# Patient Record
Sex: Male | Born: 1935 | Race: White | Hispanic: No | Marital: Married | Smoking: Never smoker
Health system: Southern US, Community
[De-identification: ages and names within clinical notes are randomized; demographics above are authoritative.]

## PROBLEM LIST (undated history)

## (undated) DIAGNOSIS — M1990 Unspecified osteoarthritis, unspecified site: Secondary | ICD-10-CM

## (undated) DIAGNOSIS — C7910 Secondary malignant neoplasm of unspecified urinary organs: Secondary | ICD-10-CM

## (undated) DIAGNOSIS — N179 Acute kidney failure, unspecified: Secondary | ICD-10-CM

## (undated) DIAGNOSIS — I214 Non-ST elevation (NSTEMI) myocardial infarction: Secondary | ICD-10-CM

## (undated) DIAGNOSIS — Z87442 Personal history of urinary calculi: Secondary | ICD-10-CM

## (undated) DIAGNOSIS — C4490 Unspecified malignant neoplasm of skin, unspecified: Secondary | ICD-10-CM

## (undated) DIAGNOSIS — H269 Unspecified cataract: Secondary | ICD-10-CM

## (undated) HISTORY — DX: Unspecified malignant neoplasm of skin, unspecified: C44.90

## (undated) HISTORY — DX: Unspecified cataract: H26.9

---
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
## 2009-03-08 ENCOUNTER — Ambulatory Visit: Payer: Self-pay | Admitting: Family Medicine

## 2014-12-29 ENCOUNTER — Inpatient Hospital Stay
Admission: EM | Admit: 2014-12-29 | Discharge: 2014-12-31 | DRG: 247 | Disposition: A | Discharge status: Home / Self Care | Payer: Medicare Other | Attending: Internal Medicine | Admitting: Internal Medicine

## 2014-12-29 ENCOUNTER — Emergency Department: Payer: Medicare Other

## 2014-12-29 DIAGNOSIS — Z955 Presence of coronary angioplasty implant and graft: Secondary | ICD-10-CM

## 2014-12-29 DIAGNOSIS — I10 Essential (primary) hypertension: Secondary | ICD-10-CM | POA: Diagnosis present

## 2014-12-29 DIAGNOSIS — F1010 Alcohol abuse, uncomplicated: Secondary | ICD-10-CM | POA: Diagnosis present

## 2014-12-29 DIAGNOSIS — I444 Left anterior fascicular block: Secondary | ICD-10-CM | POA: Diagnosis present

## 2014-12-29 DIAGNOSIS — I209 Angina pectoris, unspecified: Secondary | ICD-10-CM

## 2014-12-29 DIAGNOSIS — I219 Acute myocardial infarction, unspecified: Secondary | ICD-10-CM | POA: Insufficient documentation

## 2014-12-29 DIAGNOSIS — I214 Non-ST elevation (NSTEMI) myocardial infarction: Secondary | ICD-10-CM | POA: Diagnosis present

## 2014-12-29 DIAGNOSIS — N289 Disorder of kidney and ureter, unspecified: Secondary | ICD-10-CM | POA: Diagnosis present

## 2014-12-29 DIAGNOSIS — R079 Chest pain, unspecified: Secondary | ICD-10-CM

## 2014-12-29 HISTORY — DX: Non-ST elevation (NSTEMI) myocardial infarction: I21.4

## 2014-12-29 LAB — CBC
HEMATOCRIT: 44.4 % (ref 40.0–52.0)
HEMOGLOBIN: 15 g/dL (ref 13.0–18.0)
MCH: 30.7 pg (ref 26.0–34.0)
MCHC: 33.8 g/dL (ref 32.0–36.0)
MCV: 90.9 fL (ref 80.0–100.0)
Platelets: 316 10*3/uL (ref 150–440)
RBC: 4.88 MIL/uL (ref 4.40–5.90)
RDW: 13.4 % (ref 11.5–14.5)
WBC: 7.9 10*3/uL (ref 3.8–10.6)

## 2014-12-29 LAB — BASIC METABOLIC PANEL
ANION GAP: 7 (ref 5–15)
BUN: 14 mg/dL (ref 6–20)
CALCIUM: 9.4 mg/dL (ref 8.9–10.3)
CO2: 27 mmol/L (ref 22–32)
Chloride: 107 mmol/L (ref 101–111)
Creatinine, Ser: 1.25 mg/dL — ABNORMAL HIGH (ref 0.61–1.24)
GFR calc Af Amer: >60 mL/min (ref >60)
GFR calc non Af Amer: 53 mL/min — ABNORMAL LOW (ref >60)
GLUCOSE: 95 mg/dL (ref 65–99)
Potassium: 4.4 mmol/L (ref 3.5–5.1)
Sodium: 141 mmol/L (ref 135–145)

## 2014-12-29 LAB — CKMB (ARMC ONLY): CK, MB: 8 ng/mL — ABNORMAL HIGH (ref 0.5–5.0)

## 2014-12-29 LAB — TROPONIN I: Troponin I: 0.12 ng/mL — ABNORMAL HIGH (ref <0.031)

## 2014-12-29 LAB — BRAIN NATRIURETIC PEPTIDE: B NATRIURETIC PEPTIDE 5: 149 pg/mL — AB (ref 0.0–100.0)

## 2014-12-29 MED ORDER — SODIUM CHLORIDE 0.9 % IJ SOLN: 3.0000 mL | Freq: Two times a day (BID) | INTRAMUSCULAR | Status: DC

## 2014-12-29 MED ORDER — ACETAMINOPHEN 650 MG RE SUPP
650.0000 mg | Freq: Four times a day (QID) | RECTAL | Status: DC | PRN
Start: 2014-12-29 — End: 2014-12-31

## 2014-12-29 MED ORDER — HEPARIN (PORCINE) IN NACL 100-0.45 UNIT/ML-% IJ SOLN
1000.0000 [IU]/h | INTRAMUSCULAR | Status: DC
  Administered 2014-12-29: 1000 [IU]/h via INTRAVENOUS
  Filled 2014-12-29 (×3): qty 250

## 2014-12-29 MED ORDER — ASPIRIN EC 325 MG PO TBEC
325.0000 mg | DELAYED_RELEASE_TABLET | Freq: Every day | ORAL | Status: DC
  Administered 2014-12-30: 325 mg via ORAL
  Filled 2014-12-29: qty 1

## 2014-12-29 MED ORDER — METOPROLOL TARTRATE 25 MG PO TABS
25.0000 mg | ORAL_TABLET | Freq: Two times a day (BID) | ORAL | Status: DC
Start: 2014-12-29 — End: 2014-12-31
  Administered 2014-12-29 – 2014-12-31 (×3): 25 mg via ORAL
  Filled 2014-12-29 (×3): qty 1

## 2014-12-29 MED ORDER — ASPIRIN 81 MG PO CHEW
CHEWABLE_TABLET | ORAL | Status: AC
  Administered 2014-12-29: 324 mg via ORAL
  Filled 2014-12-29: qty 4

## 2014-12-29 MED ORDER — MORPHINE SULFATE 2 MG/ML IJ SOLN: 2.0000 mg | INTRAMUSCULAR | Status: DC | PRN

## 2014-12-29 MED ORDER — METOPROLOL TARTRATE 25 MG PO TABS
ORAL_TABLET | ORAL | Status: AC
  Administered 2014-12-29: 25 mg via ORAL
  Filled 2014-12-29: qty 1

## 2014-12-29 MED ORDER — NITROGLYCERIN 2 % TD OINT
1.0000 [in_us] | TOPICAL_OINTMENT | Freq: Four times a day (QID) | TRANSDERMAL | Status: DC
  Administered 2014-12-29 – 2014-12-31 (×5): 1 [in_us] via TOPICAL
  Filled 2014-12-29 (×4): qty 1

## 2014-12-29 MED ORDER — ONDANSETRON HCL 4 MG PO TABS: 4.0000 mg | ORAL_TABLET | Freq: Four times a day (QID) | ORAL | Status: DC | PRN

## 2014-12-29 MED ORDER — ACETAMINOPHEN 325 MG PO TABS: 650.0000 mg | ORAL_TABLET | Freq: Four times a day (QID) | ORAL | Status: DC | PRN

## 2014-12-29 MED ORDER — HEPARIN BOLUS VIA INFUSION
4000.0000 [IU] | Freq: Once | INTRAVENOUS | Status: AC
  Administered 2014-12-29: 4000 [IU] via INTRAVENOUS
  Filled 2014-12-29: qty 4000

## 2014-12-29 MED ORDER — SODIUM CHLORIDE 0.9 % IV SOLN
INTRAVENOUS | Status: DC
  Administered 2014-12-29 – 2014-12-30 (×2): via INTRAVENOUS

## 2014-12-29 MED ORDER — ASPIRIN 81 MG PO CHEW
324.0000 mg | CHEWABLE_TABLET | Freq: Once | ORAL | Status: AC
  Administered 2014-12-29: 324 mg via ORAL

## 2014-12-29 MED ORDER — NITROGLYCERIN 2 % TD OINT
TOPICAL_OINTMENT | TRANSDERMAL | Status: AC
  Administered 2014-12-29: 1 [in_us] via TOPICAL
  Filled 2014-12-29: qty 1

## 2014-12-29 MED ORDER — ONDANSETRON HCL 4 MG/2ML IJ SOLN: 4.0000 mg | Freq: Four times a day (QID) | INTRAMUSCULAR | Status: DC | PRN

## 2014-12-29 NOTE — ED Provider Notes (Signed)
[facility] Emergency Department Provider Note  ____________________________________________  Time seen: Approximately 4:02 PM  I have reviewed the triage vital signs and the nursing notes.   HISTORY  Chief Complaint Chest Pain    HPI Michael Brennan is a 81 y.o. male with no chronic medical problems presents for evaluation of 2 weeks of intermittent epigastric and left-sided aching, worse with exertion, improves with rest. Patient usually notices the pain when he is doing yardwork. If not associated with any diaphoresis, nausea, dizziness/lightheadedness. Last night he developed a minor chest pain while lying in bed. Currently his pain has resolved. He has no prior history of coronary artery disease. Pain is not radiating, not pleuritic. Current severity 0 out of 10.   No past medical history on file.  Patient Active Problem List   Diagnosis Date Noted  . Acute non Q wave MI (myocardial infarction), initial episode of care 12/29/2014    No past surgical history on file.  No current outpatient prescriptions on file.  Allergies Review of patient's allergies indicates no known allergies.  No family history on file.  Social History History  Substance Use Topics  . Smoking status: Not on file  . Smokeless tobacco: Not on file  . Alcohol Use: Not on file    Review of Systems Constitutional: No fever/chills Eyes: No visual changes. ENT: No sore throat. Cardiovascular: + chest pain. Respiratory: Denies shortness of breath. Gastrointestinal: No abdominal pain.  No nausea, no vomiting.  No diarrhea.  No constipation. Genitourinary: Negative for dysuria. Musculoskeletal: Negative for back pain. Skin: Negative for rash. Neurological: Negative for headaches, focal weakness or numbness.  10-point ROS otherwise negative.  ____________________________________________   PHYSICAL EXAM:  VITAL SIGNS: ED Triage Vitals  Enc Vitals Group     BP  12/29/14 1459 166/62 mmHg     Pulse Rate 12/29/14 1459 76     Resp 12/29/14 1459 18     Temp 12/29/14 1459 98.2 F (36.8 C)     Temp Source 12/29/14 1459 Oral     SpO2 12/29/14 1459 96 %     Weight 12/29/14 1459 170 lb (77.111 kg)     Height 12/29/14 1459 5\' 8"  (1.727 m)     Head Cir --      Peak Flow --      Pain Score 12/29/14 1502 1     Pain Loc --      Pain Edu? --      Excl. in [place]? --     Constitutional: Alert and oriented. Well appearing and in no acute distress. Eyes: Conjunctivae are normal. PERRL. EOMI. Head: Atraumatic. Nose: No congestion/rhinnorhea. Mouth/Throat: Mucous membranes are moist.  Oropharynx non-erythematous. Neck: No stridor.  Cardiovascular: Normal rate, regular rhythm. Grossly normal heart sounds.  Good peripheral circulation. Respiratory: Normal respiratory effort.  No retractions. Lungs CTAB. Gastrointestinal: Soft and nontender. No distention. No abdominal bruits. No CVA tenderness. Genitourinary: deferred Musculoskeletal: No lower extremity tenderness nor edema.  No joint effusions. Neurologic:  Normal speech and language. No gross focal neurologic deficits are appreciated. Speech is normal. No gait instability. Skin:  Skin is warm, dry and intact. No rash noted. Psychiatric: Mood and affect are normal. Speech and behavior are normal.  ____________________________________________   LABS (all labs ordered are listed, but only abnormal results are displayed)  Labs Reviewed  BASIC METABOLIC PANEL - Abnormal; Notable for the following:    Creatinine, Ser 1.25 (*)    GFR calc non Af Wyvonnia Lora  53 (*)    All other components within normal limits  TROPONIN I - Abnormal; Notable for the following:    Troponin I 0.12 (*)    All other components within normal limits  CBC   ____________________________________________  EKG  ED ECG REPORT I, Joanne Gavel, the attending physician, personally viewed and interpreted this ECG.   Date: 12/29/2014  EKG  Time: 15:04  Rate: 62  Rhythm: normal sinus rhythm  Axis: Normal axis  Intervals:left anterior fascicular block  ST&T Change: No acute ST segment elevation  ____________________________________________  RADIOLOGY  CXR FINDINGS: The heart size and mediastinal contours are within normal limits. Both lungs are clear. No evidence of pneumothorax or pleural effusion. The visualized skeletal structures are unremarkable.  IMPRESSION: No active cardiopulmonary disease.  ____________________________________________   PROCEDURES  Procedure(s) performed: None  Critical Care performed: Yes, see critical care note(s). Total critical care time spent 40 minutes.  ____________________________________________   INITIAL IMPRESSION / ASSESSMENT AND PLAN / ED COURSE  Pertinent labs & imaging results that were available during my care of the patient were reviewed by me and considered in my medical decision making (see chart for details).  Michael Brennan is a 81 y.o. male with no chronic medical problems presents for evaluation of several weeks of intermittent epigastric and left-sided aching, worse with exertion, improves with rest. Currently, very well-appearing in no acute distress. Vital signs stable, he is afebrile. EKG is reassuring however troponin elevated at 0.12 and  given his exertional complaints, my concern is for ACS. Creatinine mildly elevated at 1.25 but baseline unknown. Aspirin given. Discussed with hospitalist for admission. Not consist with PE or acute aortic dissection.  ____________________________________________   FINAL CLINICAL IMPRESSION(S) / ED DIAGNOSES  Final diagnoses:  Ischemic chest pain  NSTEMI (non-ST elevation myocardial infarction)      Joanne Gavel, MD 12/29/14 1705

## 2014-12-29 NOTE — H&P (Signed)
[place] at [place] NAME: Michael Brennan    MR#:  [id number]  DATE OF BIRTH:  1934/03/22  DATE OF ADMISSION:  12/29/2014  PRIMARY CARE PHYSICIAN: No PCP Per Patient   REQUESTING/REFERRING PHYSICIAN:   CHIEF COMPLAINT:   Chief Complaint  Patient presents with  . Chest Pain    HISTORY OF PRESENT ILLNESS: Michael Brennan  is a 81 y.o. male with no significant past medical history who presents to the hospital with a 2-1/2 weeks of intermittent chest pains. According to the patient. He's been feeling fine up until approximately 2-1/2 weeks ago when he started having epigastric as well as left-sided chest pain. Pain was described as sharp in the epigastric area and achy on the left side of the chest, intermittent,  increasing with activity with no radiation, relieved by rest. Pain was as bad as 9 out of 10 by intensity.  However, last night patient noted a pain while he was at rest in bed. He decided to come to emergency room for further evaluation. In emergency room, his EKG revealed left anterior fascicular block and no acute ST-T changes. His troponin was found to be elevated and hospitalist services were contacted for admission. Denies any chest pains at present  PAST MEDICAL HISTORY:  None .  PAST SURGICAL HISTORY: None  SOCIAL HISTORY:  History  Substance Use Topics  . Smoking status: Not on file  . Smokeless tobacco: Not on file  . Alcohol Use: Not on file   Never smoked. Married, lives with wife, patient had one son, however, he died, the cause of his lifestyle. Drinks at least 1 beer a day  FAMILY HISTORY: Patient does not know much about his family since he left his [place] Patient's mother died in 30s. Patient's father died in mid 42s because of unknown reasons .  DRUG ALLERGIES: No Known Allergies  Review of Systems  Constitutional: Negative for fever, chills, weight loss and malaise/fatigue.  HENT: Negative for  congestion.   Eyes: Negative for blurred vision and double vision.  Respiratory: Negative for cough, sputum production, shortness of breath and wheezing.   Cardiovascular: Positive for chest pain. Negative for palpitations, orthopnea, leg swelling and PND.  Gastrointestinal: Negative for nausea, vomiting, abdominal pain, diarrhea, constipation, blood in stool and melena.  Genitourinary: Negative for dysuria, urgency, frequency and hematuria.  Musculoskeletal: Negative for falls.  Skin: Negative for rash.  Neurological: Negative for dizziness and weakness.  Psychiatric/Behavioral: Negative for depression and memory loss. The patient is not nervous/anxious.     MEDICATIONS AT HOME:  Prior to Admission medications   Not on File      PHYSICAL EXAMINATION:   VITAL SIGNS: Blood pressure 173/58, pulse 69, temperature 98.2 F (36.8 C), temperature source Oral, resp. rate 16, height 5\' 8"  (1.727 m), weight 77.111 kg (170 lb), SpO2 98 %.  GENERAL:  81 y.o.-year-old patient lying in the bed with no acute distress.  EYES: Pupils equal, round, reactive to light and accommodation. No scleral icterus. Extraocular muscles intact.  HEENT: Head atraumatic, normocephalic. Oropharynx and nasopharynx clear.  NECK:  Supple, no jugular venous distention. No thyroid enlargement, no tenderness.  LUNGS: Normal breath sounds bilaterally, no wheezing, rales,rhonchi or crepitation. No use of accessory muscles of respiration.  CARDIOVASCULAR: S1, S2 normal. No murmurs, rubs, or gallops.  ABDOMEN: Soft, nontender, nondistended. Bowel sounds present. No organomegaly or mass.  EXTREMITIES: No pedal edema, cyanosis, or clubbing.  NEUROLOGIC: Cranial  nerves II through XII are intact. Muscle strength 5/5 in all extremities. Sensation intact. Gait not checked.  PSYCHIATRIC: The patient is alert and oriented x 3.  SKIN: No obvious rash, lesion, or ulcer.   LABORATORY PANEL:   CBC  Recent Labs Lab 12/29/14 1513   WBC 7.9  HGB 15.0  HCT 44.4  PLT 316  MCV 90.9  MCH 30.7  MCHC 33.8  RDW 13.4   ------------------------------------------------------------------------------------------------------------------  Chemistries   Recent Labs Lab 12/29/14 1513  NA 141  K 4.4  CL 107  CO2 27  GLUCOSE 95  BUN 14  CREATININE 1.25*  CALCIUM 9.4   ------------------------------------------------------------------------------------------------------------------  Cardiac Enzymes  Recent Labs Lab 12/29/14 1513  TROPONINI 0.12*   ------------------------------------------------------------------------------------------------------------------  RADIOLOGY: Dg Chest Portable 1 View  12/29/2014   CLINICAL DATA:  Left-sided chest pain for 2 weeks.  EXAM: PORTABLE CHEST - 1 VIEW  COMPARISON:  None.  FINDINGS: The heart size and mediastinal contours are within normal limits. Both lungs are clear. No evidence of pneumothorax or pleural effusion. The visualized skeletal structures are unremarkable.  IMPRESSION: No active cardiopulmonary disease.   Electronically Signed   By: Earle Gell M.D.   On: 12/29/2014 16:24    EKG: Orders placed or performed during the hospital encounter of 12/29/14  . ED EKG (<55mins upon arrival to the ED)  . ED EKG (<4mins upon arrival to the ED)   EKG showed normal sinus rhythm at 62 bpm, left anterior fascicular block, and no acute ST-T changes  IMPRESSION AND PLAN:  Principal Problem:   Acute non Q wave MI (myocardial infarction), initial episode of care  1. Acute non-Q-wave MI. Initial episode of care admit patient to medical floor, telemetry, start him on metoprolol, aspirin, nitroglycerin as well as heparin IV. Get cardiology consultation for possible cardiac catheterization, follow cardiac enzymes 3 2. Malignant essential hypertension. Initiate nitroglycerin as well as metoprolol. Advanced medications as needed 3. Renal insufficiency, likely due to his  long-standing hypertension. Initiate patient on low rate IV fluids. Get urinalysis 4. Alcohol abuse. Watch for withdrawal  All the records are reviewed and case discussed with ED provider. Management plans discussed with the patient, family and they are in agreement.  CODE STATUS: Full code   TOTAL TIME TAKING CARE OF THIS PATIENT: 55 minutes.    Theodoro Grist M.D on 12/29/2014 at 5:10 PM  Between 7am to 6pm - Pager - [phone] After 6pm go to [email or web address] - password [email or web address] [facility]  [facility]  Office  [phone]  CC: Primary care physician; No PCP Per Patient

## 2014-12-29 NOTE — Progress Notes (Addendum)
ANTICOAGULATION CONSULT NOTE - Initial Consult  Pharmacy Consult for Heparin Indication: chest pain/ACS  No Known Allergies  Patient Measurements: Height: 5\' 8"  (172.7 cm) Weight: 169 lb (76.658 kg) IBW/kg (Calculated) : 68.4 Heparin Dosing Weight: 77.1 kg  Vital Signs: Temp: 97.7 F (36.5 C) (06/22 1812) Temp Source: Oral (06/22 1812) BP: 157/81 mmHg (06/22 1812) Pulse Rate: 64 (06/22 1812)  Labs:  Recent Labs  12/29/14 1513  HGB 15.0  HCT 44.4  PLT 316  CREATININE 1.25*  TROPONINI 0.12*    Estimated Creatinine Clearance: 47.1 mL/min (by C-G formula based on Cr of 1.25).   Medical History: History reviewed. No pertinent past medical history.  Medications:  No prescriptions prior to admission    Assessment: ACS/NSTEMI Goal of Therapy:    Monitor platelets by anticoagulation protocol: Yes   Plan:  Give 4000 units bolus x 1 Start heparin infusion at 1000 units/hr  Will draw 1st HL 8 hrs after start of drip on 6/23 @ 0400.  Kailene Steinhart D 12/29/2014,7:19 PM

## 2014-12-29 NOTE — ED Notes (Signed)
Patient presents with "lingering echo of chest pain". Patient states that he has 10/10 centralized chest pain on exertion with radiation to the left chest...however patient now has 1/10 pain.  Patient denies any other symptoms.

## 2014-12-30 ENCOUNTER — Encounter: Payer: Self-pay | Admitting: *Deleted

## 2014-12-30 ENCOUNTER — Encounter
Admission: EM | Disposition: A | Discharge status: Home / Self Care | Payer: Self-pay | Source: Home / Self Care | Attending: Internal Medicine

## 2014-12-30 HISTORY — PX: CARDIAC CATHETERIZATION: SHX172

## 2014-12-30 LAB — CBC
HCT: 40 % (ref 40.0–52.0)
Hemoglobin: 13.5 g/dL (ref 13.0–18.0)
MCH: 30.8 pg (ref 26.0–34.0)
MCHC: 33.8 g/dL (ref 32.0–36.0)
MCV: 91.1 fL (ref 80.0–100.0)
Platelets: 276 10*3/uL (ref 150–440)
RBC: 4.4 MIL/uL (ref 4.40–5.90)
RDW: 13.7 % (ref 11.5–14.5)
WBC: 7.4 10*3/uL (ref 3.8–10.6)

## 2014-12-30 LAB — BASIC METABOLIC PANEL
ANION GAP: 9 (ref 5–15)
BUN: 15 mg/dL (ref 6–20)
CALCIUM: 8.8 mg/dL — AB (ref 8.9–10.3)
CHLORIDE: 110 mmol/L (ref 101–111)
CO2: 25 mmol/L (ref 22–32)
Creatinine, Ser: 1.3 mg/dL — ABNORMAL HIGH (ref 0.61–1.24)
GFR calc Af Amer: 59 mL/min — ABNORMAL LOW (ref >60)
GFR calc non Af Amer: 51 mL/min — ABNORMAL LOW (ref >60)
GLUCOSE: 91 mg/dL (ref 65–99)
Potassium: 4.1 mmol/L (ref 3.5–5.1)
SODIUM: 144 mmol/L (ref 135–145)

## 2014-12-30 LAB — HEPARIN LEVEL (UNFRACTIONATED)
HEPARIN UNFRACTIONATED: 0.62 [IU]/mL (ref 0.30–0.70)
HEPARIN UNFRACTIONATED: 0.47 [IU]/mL (ref 0.30–0.70)

## 2014-12-30 LAB — HEMOGLOBIN A1C: HEMOGLOBIN A1C: 5.1 % (ref 4.0–6.0)

## 2014-12-30 LAB — TROPONIN I
TROPONIN I: 0.53 ng/mL — AB (ref <0.031)
TROPONIN I: 1.08 ng/mL — AB (ref <0.031)

## 2014-12-30 LAB — APTT: aPTT: 102 seconds — ABNORMAL HIGH (ref 24–36)

## 2014-12-30 SURGERY — LEFT HEART CATH
Anesthesia: Moderate Sedation

## 2014-12-30 MED ORDER — MIDAZOLAM HCL 2 MG/2ML IJ SOLN
INTRAMUSCULAR | Status: AC
  Filled 2014-12-30: qty 2

## 2014-12-30 MED ORDER — HEPARIN (PORCINE) IN NACL 2-0.9 UNIT/ML-% IJ SOLN
INTRAMUSCULAR | Status: AC
  Filled 2014-12-30: qty 500

## 2014-12-30 MED ORDER — FENTANYL CITRATE (PF) 100 MCG/2ML IJ SOLN
INTRAMUSCULAR | Status: DC | PRN
  Administered 2014-12-30: 50 ug via INTRAVENOUS

## 2014-12-30 MED ORDER — SODIUM CHLORIDE 0.9 % IV SOLN: 250.0000 mL | INTRAVENOUS | Status: DC | PRN

## 2014-12-30 MED ORDER — ACETAMINOPHEN 325 MG PO TABS: 650.0000 mg | ORAL_TABLET | ORAL | Status: DC | PRN

## 2014-12-30 MED ORDER — IOHEXOL 300 MG/ML  SOLN
INTRAMUSCULAR | Status: DC | PRN
  Administered 2014-12-30: 50 mL via INTRA_ARTERIAL
  Administered 2014-12-30: 120 mL via INTRA_ARTERIAL
  Administered 2014-12-30: 100 mL via INTRA_ARTERIAL

## 2014-12-30 MED ORDER — ATORVASTATIN CALCIUM 20 MG PO TABS: 40.0000 mg | ORAL_TABLET | Freq: Every day | ORAL | Status: DC

## 2014-12-30 MED ORDER — SODIUM CHLORIDE 0.9 % WEIGHT BASED INFUSION: 3.0000 mL/kg/h | INTRAVENOUS | Status: AC

## 2014-12-30 MED ORDER — SODIUM CHLORIDE 0.9 % IJ SOLN: 3.0000 mL | INTRAMUSCULAR | Status: DC | PRN

## 2014-12-30 MED ORDER — BIVALIRUDIN BOLUS VIA INFUSION - CUPID
INTRAVENOUS | Status: DC | PRN
  Administered 2014-12-30: 58.425 mg via INTRAVENOUS

## 2014-12-30 MED ORDER — CLOPIDOGREL BISULFATE 75 MG PO TABS
ORAL_TABLET | ORAL | Status: AC
  Filled 2014-12-30: qty 1

## 2014-12-30 MED ORDER — CLOPIDOGREL BISULFATE 75 MG PO TABS
ORAL_TABLET | ORAL | Status: AC
  Filled 2014-12-30: qty 4

## 2014-12-30 MED ORDER — CLOPIDOGREL BISULFATE 75 MG PO TABS
ORAL_TABLET | ORAL | Status: DC | PRN
  Administered 2014-12-30: 600 mg via ORAL

## 2014-12-30 MED ORDER — ASPIRIN EC 325 MG PO TBEC: 325.0000 mg | DELAYED_RELEASE_TABLET | Freq: Every day | ORAL | Status: DC

## 2014-12-30 MED ORDER — ASPIRIN 81 MG PO CHEW
81.0000 mg | CHEWABLE_TABLET | ORAL | Status: AC
  Administered 2014-12-31: 81 mg via ORAL
  Filled 2014-12-30: qty 1

## 2014-12-30 MED ORDER — BIVALIRUDIN 250 MG IV SOLR
INTRAVENOUS | Status: AC
  Filled 2014-12-30: qty 250

## 2014-12-30 MED ORDER — FENTANYL CITRATE (PF) 100 MCG/2ML IJ SOLN
INTRAMUSCULAR | Status: AC
  Filled 2014-12-30: qty 2

## 2014-12-30 MED ORDER — NITROGLYCERIN 5 MG/ML IV SOLN
INTRAVENOUS | Status: AC
  Filled 2014-12-30: qty 10

## 2014-12-30 MED ORDER — SODIUM CHLORIDE 0.9 % IJ SOLN: 3.0000 mL | Freq: Two times a day (BID) | INTRAMUSCULAR | Status: DC

## 2014-12-30 MED ORDER — SODIUM CHLORIDE 0.9 % IV SOLN: INTRAVENOUS | Status: DC

## 2014-12-30 MED ORDER — SODIUM CHLORIDE 0.9 % IJ SOLN
3.0000 mL | Freq: Two times a day (BID) | INTRAMUSCULAR | Status: DC
  Administered 2014-12-30: 3 mL via INTRAVENOUS

## 2014-12-30 MED ORDER — CLOPIDOGREL BISULFATE 75 MG PO TABS
ORAL_TABLET | ORAL | Status: AC
  Filled 2014-12-30: qty 3

## 2014-12-30 MED ORDER — CLOPIDOGREL BISULFATE 75 MG PO TABS
75.0000 mg | ORAL_TABLET | Freq: Every day | ORAL | Status: DC
  Administered 2014-12-31: 75 mg via ORAL
  Filled 2014-12-30: qty 1

## 2014-12-30 MED ORDER — MIDAZOLAM HCL 2 MG/2ML IJ SOLN
INTRAMUSCULAR | Status: DC | PRN
  Administered 2014-12-30 (×2): 1 mg via INTRAVENOUS

## 2014-12-30 MED ORDER — NITROGLYCERIN 1 MG/10 ML FOR IR/CATH LAB
INTRA_ARTERIAL | Status: DC | PRN
  Administered 2014-12-30: 200 ug via INTRACORONARY

## 2014-12-30 MED ORDER — ONDANSETRON HCL 4 MG/2ML IJ SOLN
4.0000 mg | Freq: Four times a day (QID) | INTRAMUSCULAR | Status: DC | PRN
Start: 2014-12-30 — End: 2014-12-31

## 2014-12-30 SURGICAL SUPPLY — 15 items
CATH INFINITI 5 FR 3DRC (CATHETERS) ×4 IMPLANT
CATH INFINITI 5FR ANG PIGTAIL (CATHETERS) ×4 IMPLANT
CATH INFINITI 5FR JL4 (CATHETERS) ×4 IMPLANT
CATH INFINITI JR4 5F (CATHETERS) ×4 IMPLANT
CATH VISTA GUIDE 6FR XB3.5 (CATHETERS) ×4 IMPLANT
DEVICE CLOSURE MYNXGRIP 6/7F (Vascular Products) ×4 IMPLANT
DEVICE INFLAT 30 PLUS (MISCELLANEOUS) ×4 IMPLANT
KIT MANI 3VAL PERCEP (MISCELLANEOUS) ×4 IMPLANT
NEEDLE PERC 18GX7CM (NEEDLE) ×4 IMPLANT
PACK CARDIAC CATH (CUSTOM PROCEDURE TRAY) ×4 IMPLANT
SHEATH AVANTI 5FR X 11CM (SHEATH) ×4 IMPLANT
SHEATH AVANTI 6FR X 11CM (SHEATH) ×4 IMPLANT
STENT XIENCE ALPINE RX 3.25X12 (Permanent Stent) ×4 IMPLANT
WIRE ASAHI PROWATER 180CM (WIRE) ×4 IMPLANT
WIRE EMERALD 3MM-J .035X150CM (WIRE) ×4 IMPLANT

## 2014-12-30 NOTE — Progress Notes (Signed)
ANTICOAGULATION CONSULT NOTE - Follow Up Consult  Pharmacy Consult for Heparin Drip Indication: chest pain/ACS  No Known Allergies  Patient Measurements: Height: 5\' 8"  (172.7 cm) Weight: 171 lb 11.2 oz (77.883 kg) IBW/kg (Calculated) : 68.4 Heparin Dosing Weight: 77 kg  Vital Signs: Temp: 98.5 F (36.9 C) (06/23 1134) Temp Source: Oral (06/23 1134) BP: 117/58 mmHg (06/23 1134) Pulse Rate: 74 (06/23 1134)  Labs:  Recent Labs  12/29/14 1513 12/29/14 2100 12/29/14 2104 12/30/14 0434 12/30/14 1205  HGB 15.0  --   --  13.5  --   HCT 44.4  --   --  40.0  --   PLT 316  --   --  276  --   APTT  --   --   --  102*  --   HEPARINUNFRC  --   --   --  0.62 0.47  CREATININE 1.25*  --   --  1.30*  --   CKMB  --   --  8.0*  --   --   TROPONINI 0.12* 0.53*  --  1.08*  --     Estimated Creatinine Clearance: 45.3 mL/min (by C-G formula based on Cr of 1.3).   Medications:  Infusions:  . sodium chloride 50 mL/hr at 12/30/14 1301  . heparin 1,000 Units/hr (12/29/14 2036)    Assessment: Heparin level of 0.47. Heparin drip currently running at 1000 units/hr.  Goal of Therapy:  Heparin level 0.3-0.7 units/ml Monitor platelets by anticoagulation protocol: Yes   Plan:  Will continue with current regimen and check HL with daily labs.  Paulina Fusi, PharmD, BCPS 12/30/2014 2:30 PM

## 2014-12-30 NOTE — Progress Notes (Signed)
[place] at [place] NAME: Michael Brennan    MR#:  [id number]  DATE OF BIRTH:  10/18/1933  SUBJECTIVE:  CHIEF COMPLAINT:   Chief Complaint  Patient presents with  . Chest Pain   patient here with chest pain which is now resolved. Patient has ruled in for a non-Q-wave MI with serial elevated troponins. Currently chest pain-free and hemodynamically stable.   REVIEW OF SYSTEMS:    Review of Systems  Constitutional: Negative for fever and chills.  HENT: Negative for congestion and tinnitus.   Eyes: Negative for blurred vision and double vision.  Respiratory: Negative for cough, shortness of breath and wheezing.   Cardiovascular: Negative for chest pain, orthopnea and PND.  Gastrointestinal: Negative for nausea, vomiting, abdominal pain and diarrhea.  Genitourinary: Negative for dysuria and hematuria.  Neurological: Negative for dizziness, sensory change and focal weakness.  All other systems reviewed and are negative.   Nutrition: NPO for possible cath.  Tolerating Diet: No Tolerating PT: Ambulatory.     DRUG ALLERGIES:  No Known Allergies  VITALS:  Blood pressure 117/58, pulse 74, temperature 98.5 F (36.9 C), temperature source Oral, resp. rate 18, height 5\' 8"  (1.727 m), weight 77.883 kg (171 lb 11.2 oz), SpO2 97 %.  PHYSICAL EXAMINATION:   Physical Exam  GENERAL:  81 y.o.-year-old patient lying in the bed with no acute distress.  EYES: Pupils equal, round, reactive to light and accommodation. No scleral icterus. Extraocular muscles intact.  HEENT: atraumatic, normocephalic. Oropharynx and nasopharynx clear.  NECK:  Supple, no jugular venous distention. No thyroid enlargement, no tenderness.  LUNGS: Normal breath sounds bilaterally, no wheezing, rales, rhonchi. No use of accessory muscles of respiration.  CARDIOVASCULAR: S1, S2 RRR. No murmurs, rubs, or gallops.  ABDOMEN: Soft, nontender, nondistended. Bowel sounds  present. No organomegaly or mass.  EXTREMITIES: No cyanosis, clubbing or edema b/l.    NEUROLOGIC: Cranial nerves II through XII are intact. No focal Motor or sensory deficits b/l.   PSYCHIATRIC: The patient is alert and oriented x 3. Good affect.  SKIN: No obvious rash, lesion, or ulcer.    LABORATORY PANEL:   CBC  Recent Labs Lab 12/30/14 0434  WBC 7.4  HGB 13.5  HCT 40.0  PLT 276   ------------------------------------------------------------------------------------------------------------------  Chemistries   Recent Labs Lab 12/30/14 0434  NA 144  K 4.1  CL 110  CO2 25  GLUCOSE 91  BUN 15  CREATININE 1.30*  CALCIUM 8.8*   ------------------------------------------------------------------------------------------------------------------  Cardiac Enzymes  Recent Labs Lab 12/30/14 0434  TROPONINI 1.08*   ------------------------------------------------------------------------------------------------------------------  RADIOLOGY:  Dg Chest Portable 1 View  12/29/2014   CLINICAL DATA:  Left-sided chest pain for 2 weeks.  EXAM: PORTABLE CHEST - 1 VIEW  COMPARISON:  None.  FINDINGS: The heart size and mediastinal contours are within normal limits. Both lungs are clear. No evidence of pneumothorax or pleural effusion. The visualized skeletal structures are unremarkable.  IMPRESSION: No active cardiopulmonary disease.   Electronically Signed   By: Earle Gell M.D.   On: 12/29/2014 16:24     ASSESSMENT AND PLAN:   81 year old male with no significant past medical history who presented to the hospital with intermittent chest pain.   #1 non-Q wave MI-this is likely the cause of patient's chest pain. -Patient's serial troponins have trended upwards. Although patient is presently chest pain-free and hemodynamically stable. -Continue aspirin, heparin drip, metoprolol and will add atorvastatin. -Discuss with cardiology and patient likely to  have a cardiac catheter later  today. Await further recommendations from cardiology at this point.   All the records are reviewed and case discussed with Care Management/Social Workerr. Management plans discussed with the patient, family and they are in agreement.  CODE STATUS: Full  DVT Prophylaxis: Heparin drip  TOTAL TIME TAKING CARE OF THIS PATIENT: 35 minutes.   POSSIBLE D/C pending cardiac catheterization  Greater than 50% of time spent on coordination of care.  Henreitta Leber M.D on 12/30/2014 at 11:49 AM  Between 7am to 6pm - Pager - [phone]  After 6pm go to [email or web address] - password [email or web address]  [place] [place] [facility]  Office  [phone]  CC: Primary care physician; No PCP Per Patient

## 2014-12-30 NOTE — Progress Notes (Signed)
ANTICOAGULATION CONSULT NOTE - Initial Consult  Pharmacy Consult for Heparin Indication: chest pain/ACS  No Known Allergies  Patient Measurements: Height: 5\' 8"  (172.7 cm) Weight: 171 lb 11.2 oz (77.883 kg) IBW/kg (Calculated) : 68.4 Heparin Dosing Weight: 77.1 kg  Vital Signs: Temp: 98.5 F (36.9 C) (06/23 0357) Temp Source: Oral (06/23 0357) BP: 100/52 mmHg (06/23 0357) Pulse Rate: 64 (06/23 0357)  Labs:  Recent Labs  12/29/14 1513 12/29/14 2104 12/30/14 0434  HGB 15.0  --  13.5  HCT 44.4  --  40.0  PLT 316  --  276  APTT  --   --  102*  HEPARINUNFRC  --   --  0.62  CREATININE 1.25*  --   --   CKMB  --  8.0*  --   TROPONINI 0.12*  --   --     Estimated Creatinine Clearance: 47.1 mL/min (by C-G formula based on Cr of 1.25).   Medical History: History reviewed. No pertinent past medical history.  Medications:  No prescriptions prior to admission    Assessment: ACS/NSTEMI  Goal of Therapy:   Monitor platelets by anticoagulation protocol: Yes   Plan:  Give 4000 units bolus x 1 Start heparin infusion at 1000 units/hr  Will draw 1st HL 8 hrs after start of drip on 6/23 @ 0400.    6/23 @ 04:34 Anti-Xa = 0.62.  Continue current dosing.   Next Anti-Xa level ordered for 6/23 at 12:30.    [place] Clinical Pharmacist 12/30/2014,5:38 AM

## 2014-12-30 NOTE — Consult Note (Signed)
[facility]  CARDIOLOGY CONSULT NOTE  Patient ID: Michael Brennan MRN: [id number] DOB/AGE: 79/13/1935 79 y.o.  Admit date: 12/29/2014 Referring Physician Verdell Carmine Primary Physician [facility] Primary Cardiologist  Reason for Consultation non-ST elevation myocardial infarction  HPI: The patient is a 81 year old gentleman referred for evaluation of non-ST elevation myocardial infarction. The patient reports a 2 to three-week history of intermittent episodes of mid epigastric and left-sided chest discomfort. These episodes appear to be exacerbated by activity and exertion and relieved by rest. He presented to [facility] emergency room with prolonged episode of chest discomfort. Admission labs were notable for elevated troponin consistent with non-ST elevation myocardial infarction. She was admitted to telemetry and treated with heparin drip. Patient currently denies chest pain.  Review of systems complete and found to be negative unless listed above     History reviewed. No pertinent past medical history.  History reviewed. No pertinent past surgical history.  No prescriptions prior to admission   History   Social History  . Marital Status: Married    Spouse Name: N/A  . Number of Children: N/A  . Years of Education: N/A   Occupational History  . Not on file.   Social History Main Topics  . Smoking status: Never Smoker   . Smokeless tobacco: Not on file  . Alcohol Use: Not on file  . Drug Use: Not on file  . Sexual Activity: Not on file   Other Topics Concern  . Not on file   Social History Narrative  . No narrative on file    History reviewed. No pertinent family history.    Review of systems complete and found to be negative unless listed above      PHYSICAL EXAM  General: Well developed, well nourished, in no acute distress HEENT:  Normocephalic and atramatic Neck:  No JVD.  Lungs: Clear bilaterally to auscultation and percussion. Heart: HRRR . Normal S1 and S2  without gallops or murmurs.  Abdomen: Bowel sounds are positive, abdomen soft and non-tender  Msk:  Back normal, normal gait. Normal strength and tone for age. Extremities: No clubbing, cyanosis or edema.   Neuro: Alert and oriented X 3. Psych:  Good affect, responds appropriately  Labs:   Lab Results  Component Value Date   WBC 7.4 12/30/2014   HGB 13.5 12/30/2014   HCT 40.0 12/30/2014   MCV 91.1 12/30/2014   PLT 276 12/30/2014    Recent Labs Lab 12/30/14 0434  NA 144  K 4.1  CL 110  CO2 25  BUN 15  CREATININE 1.30*  CALCIUM 8.8*  GLUCOSE 91   Lab Results  Component Value Date   CKMB 8.0* 12/29/2014   TROPONINI 1.08* 12/30/2014   No results found for: CHOL No results found for: HDL No results found for: LDLCALC No results found for: TRIG No results found for: CHOLHDL No results found for: LDLDIRECT    Radiology: Dg Chest Portable 1 View  12/29/2014   CLINICAL DATA:  Left-sided chest pain for 2 weeks.  EXAM: PORTABLE CHEST - 1 VIEW  COMPARISON:  None.  FINDINGS: The heart size and mediastinal contours are within normal limits. Both lungs are clear. No evidence of pneumothorax or pleural effusion. The visualized skeletal structures are unremarkable.  IMPRESSION: No active cardiopulmonary disease.   Electronically Signed   By: Earle Gell M.D.   On: 12/29/2014 16:24    EKG: Sinus rhythm  ASSESSMENT AND PLAN:   81 year old gentleman who presents with 2-3 week history of  intermittent episodes of chest pain. EKG is nondiagnostic. Bone is elevated consistent with non-ST elevation myocardial infarction.  Recommendations  1. Continue current medications 2. Continue heparin drip 3. Proceedwith cardiac catheterization and selective coronary arteriography. The risks, benefits and alternatives are explained to the patient and informed written consent was obtained.  SignedIsaias Cowman MD,PhD, [facility] 12/30/2014, 2:32 PM

## 2014-12-31 ENCOUNTER — Encounter: Payer: Self-pay | Admitting: Internal Medicine

## 2014-12-31 DIAGNOSIS — Z955 Presence of coronary angioplasty implant and graft: Secondary | ICD-10-CM

## 2014-12-31 LAB — CBC
HEMATOCRIT: 40.7 % (ref 40.0–52.0)
HEMOGLOBIN: 14.1 g/dL (ref 13.0–18.0)
MCH: 31.5 pg (ref 26.0–34.0)
MCHC: 34.6 g/dL (ref 32.0–36.0)
MCV: 90.9 fL (ref 80.0–100.0)
Platelets: 263 10*3/uL (ref 150–440)
RBC: 4.48 MIL/uL (ref 4.40–5.90)
RDW: 13.2 % (ref 11.5–14.5)
WBC: 7 10*3/uL (ref 3.8–10.6)

## 2014-12-31 MED ORDER — ATORVASTATIN CALCIUM 40 MG PO TABS: 40.0000 mg | ORAL_TABLET | Freq: Every day | ORAL | Status: DC

## 2014-12-31 MED ORDER — CLOPIDOGREL BISULFATE 75 MG PO TABS: 75.0000 mg | ORAL_TABLET | Freq: Every day | ORAL | Status: DC

## 2014-12-31 MED ORDER — ONDANSETRON HCL 4 MG PO TABS: 4.0000 mg | ORAL_TABLET | Freq: Four times a day (QID) | ORAL | Status: DC | PRN

## 2014-12-31 MED ORDER — MORPHINE SULFATE 2 MG/ML IJ SOLN: 2.0000 mg | INTRAMUSCULAR | Status: DC | PRN

## 2014-12-31 MED ORDER — ASPIRIN 81 MG PO CHEW: 81.0000 mg | CHEWABLE_TABLET | Freq: Every day | ORAL | Status: DC

## 2014-12-31 MED ORDER — NITROGLYCERIN 0.4 MG SL SUBL: 0.4000 mg | SUBLINGUAL_TABLET | SUBLINGUAL | Status: DC | PRN

## 2014-12-31 MED ORDER — ONDANSETRON HCL 4 MG/2ML IJ SOLN: 4.0000 mg | Freq: Four times a day (QID) | INTRAMUSCULAR | Status: DC | PRN

## 2014-12-31 MED ORDER — METOPROLOL TARTRATE 25 MG PO TABS: 25.0000 mg | ORAL_TABLET | Freq: Two times a day (BID) | ORAL | Status: DC

## 2014-12-31 MED ORDER — NITROGLYCERIN 2 % TD OINT: 1.0000 [in_us] | TOPICAL_OINTMENT | Freq: Four times a day (QID) | TRANSDERMAL | Status: DC

## 2014-12-31 NOTE — Care Management (Signed)
Patient is not being discharged on any cost prohibitive anticoagulant

## 2014-12-31 NOTE — Progress Notes (Signed)
Dr. Benjie Karvonen notified that heparin order was never discontinued per Pharmacist request.

## 2014-12-31 NOTE — Progress Notes (Signed)
Patient given discharge teaching and paperwork regarding medications, diet, follow-up appointments, vascular access site, cardiac rehab and activity. Patient understanding verbalized. No complaints at this time. IV and telemetry discontinued. Skin assessment as previously charted and vitals are stable. Patient being discharged to home. Caregiver/family present during discharge teaching.  Toniann Ket

## 2014-12-31 NOTE — Discharge Summary (Signed)
[facility] [place] NAME: Reese Stockman    MR#:  [id number]  DATE OF BIRTH:  02-25-1934  DATE OF ADMISSION:  12/29/2014 ADMITTING PHYSICIAN: Theodoro Grist, MD  DATE OF DISCHARGE: 12/31/2014  PRIMARY CARE PHYSICIAN: No PCP Per Patient    ADMISSION DIAGNOSIS:  NSTEMI (non-ST elevation myocardial infarction) [I21.4] Ischemic chest pain [I20.9]  DISCHARGE DIAGNOSIS:  Principal Problem:   Acute non Q wave MI (myocardial infarction), initial episode of care Active Problems:   S/P drug eluting coronary stent placement   SECONDARY DIAGNOSIS:   Past Medical History  Diagnosis Date  . NSTEMI (non-ST elevated myocardial infarction)     HOSPITAL COURSE:  This is a 81 year old male with no past medical history who presented with non-STEMI area for further details please refer the H&P.  1. Acute non-STEMI: Patient was admitted to the hospital service. He was started on metoprolol, aspirin, nitroglycerin and heparin IV. Cardiology consultation was obtained. Patient underwent a cardiac catheterization. Patient has a stent placed and LAD. Patient will need Plavix and aspirin. Patient was chest pain-free during hospital relation.   DISCHARGE CONDITIONS AND DIET:  Heart healthy diet Patient is being discharged home in stable condition  CONSULTS OBTAINED:  Treatment Team:  Isaias Cowman, MD  DRUG ALLERGIES:  No Known Allergies  DISCHARGE MEDICATIONS:   Current Discharge Medication List    START taking these medications   Details  aspirin (ASPIRIN CHILDRENS) 81 MG chewable tablet Chew 1 tablet (81 mg total) by mouth daily. Qty: 120 tablet, Refills: 0    atorvastatin (LIPITOR) 40 MG tablet Take 1 tablet (40 mg total) by mouth daily at 6 PM. Qty: 30 tablet, Refills: 0    clopidogrel (PLAVIX) 75 MG tablet Take 1 tablet (75 mg total) by mouth daily with breakfast. Qty: 30 tablet, Refills: 0    metoprolol tartrate (LOPRESSOR)  25 MG tablet Take 1 tablet (25 mg total) by mouth 2 (two) times daily. Qty: 60 tablet, Refills: 0    nitroGLYCERIN (NITROGLYN) 2 % ointment Apply 1 inch topically every 6 (six) hours. Qty: 30 g, Refills: 0    nitroGLYCERIN (NITROSTAT) 0.4 MG SL tablet Place 1 tablet (0.4 mg total) under the tongue every 5 (five) minutes as needed for chest pain (if having Chest pain after 3 doses call MD/911). Qty: 30 tablet, Refills: 0    ondansetron (ZOFRAN) 4 MG tablet Take 1 tablet (4 mg total) by mouth every 6 (six) hours as needed for nausea. Qty: 20 tablet, Refills: 0    ondansetron (ZOFRAN) 4 MG/2ML SOLN injection Inject 2 mLs (4 mg total) into the vein every 6 (six) hours as needed for nausea. Qty: 2 mL, Refills: 0              Today   CHIEF COMPLAINT:  Patient has no acute issues. Patient denies chest pain or shortness of breath.   VITAL SIGNS:  Blood pressure 131/65, pulse 65, temperature 98.7 F (37.1 C), temperature source Oral, resp. rate 20, height 5\' 8"  (1.727 m), weight 77.792 kg (171 lb 8 oz), SpO2 98 %.   REVIEW OF SYSTEMS:  Review of Systems  Constitutional: Negative for fever, chills and malaise/fatigue.  HENT: Negative for sore throat.   Eyes: Negative for blurred vision.  Respiratory: Negative for cough, hemoptysis, shortness of breath and wheezing.   Cardiovascular: Negative for chest pain, palpitations and leg swelling.  Gastrointestinal: Negative for nausea, vomiting, abdominal pain, diarrhea and blood in  stool.  Genitourinary: Negative for dysuria.  Musculoskeletal: Negative for back pain.  Neurological: Negative for dizziness, tremors and headaches.  Endo/Heme/Allergies: Does not bruise/bleed easily.     PHYSICAL EXAMINATION:  GENERAL:  81 y.o.-year-old patient lying in the bed with no acute distress.  NECK:  Supple, no jugular venous distention. No thyroid enlargement, no tenderness.  LUNGS: Normal breath sounds bilaterally, no wheezing, rales,rhonchi   No use of accessory muscles of respiration.  CARDIOVASCULAR: S1, S2 normal. No murmurs, rubs, or gallops.  ABDOMEN: Soft, non-tender, non-distended. Bowel sounds present. No organomegaly or mass.  EXTREMITIES: No pedal edema, cyanosis, or clubbing.  PSYCHIATRIC: The patient is alert and oriented x 3.  SKIN: No obvious rash, lesion, or ulcer.   DATA REVIEW:   CBC  Recent Labs Lab 12/31/14 0352  WBC 7.0  HGB 14.1  HCT 40.7  PLT 263    Chemistries   Recent Labs Lab 12/30/14 0434  NA 144  K 4.1  CL 110  CO2 25  GLUCOSE 91  BUN 15  CREATININE 1.30*  CALCIUM 8.8*    Cardiac Enzymes  Recent Labs Lab 12/29/14 1513 12/29/14 2100 12/30/14 0434  TROPONINI 0.12* 0.53* 1.08*    Microbiology Results  @[email or web address] @  RADIOLOGY:  Dg Chest Portable 1 View  12/29/2014     IMPRESSION: No active cardiopulmonary disease.   Electronically Signed   By: Earle Gell M.D.   On: 12/29/2014 16:24      Management plans discussed with the patient and he is in agreement. Stable for discharge home  Patient should follow up with Dr. Lorinda Creed in 1 week  CODE STATUS:     Code Status Orders        Start     Ordered   12/30/14 1647  Full code   Continuous     12/30/14 1648      TOTAL TIME TAKING CARE OF THIS PATIENT: 35 minutes.    [place] Stell M.D on 12/31/2014 at 11:32 AM  Between 7am to 6pm - Pager - [phone] After 6pm go to [email or web address] - password [email or web address] [facility]  [facility]  Office  [phone]  CC: Primary care physician; No PCP Per Patient

## 2014-12-31 NOTE — Progress Notes (Signed)
[facility]  SUBJECTIVE: I don't have chest pain   Filed Vitals:   12/30/14 2145 12/30/14 2319 12/31/14 0003 12/31/14 0454  BP: 133/60 119/60 101/49 107/56  Pulse: 64 59 62 62  Temp:  98.7 F (37.1 C) 98.2 F (36.8 C) 98.7 F (37.1 C)  TempSrc:  Oral Oral Oral  Resp:   20 20  Height:      Weight:    77.792 kg (171 lb 8 oz)  SpO2: 97% 97% 99% 98%     Intake/Output Summary (Last 24 hours) at 12/31/14 0801 Last data filed at 12/31/14 0700  Gross per 24 hour  Intake    600 ml  Output   1350 ml  Net   -750 ml      PHYSICAL EXAM  General: Well developed, well nourished, in no acute distress HEENT:  Normocephalic and atramatic Neck:  No JVD.  Lungs: Clear bilaterally to auscultation and percussion. Heart: HRRR . Normal S1 and S2 without gallops or murmurs.  Abdomen: Bowel sounds are positive, abdomen soft and non-tender  Msk:  Back normal, normal gait. Normal strength and tone for age. Extremities: No clubbing, cyanosis or edema.   Neuro: Alert and oriented X 3. Psych:  Good affect, responds appropriately   LABS: Basic Metabolic Panel:  Recent Labs  12/29/14 1513 12/30/14 0434  NA 141 144  K 4.4 4.1  CL 107 110  CO2 27 25  GLUCOSE 95 91  BUN 14 15  CREATININE 1.25* 1.30*  CALCIUM 9.4 8.8*   Liver Function Tests: No results for input(s): AST, ALT, ALKPHOS, BILITOT, PROT, ALBUMIN in the last 72 hours. No results for input(s): LIPASE, AMYLASE in the last 72 hours. CBC:  Recent Labs  12/30/14 0434 12/31/14 0352  WBC 7.4 7.0  HGB 13.5 14.1  HCT 40.0 40.7  MCV 91.1 90.9  PLT 276 263   Cardiac Enzymes:  Recent Labs  12/29/14 1513 12/29/14 2100 12/29/14 2104 12/30/14 0434  CKMB  --   --  8.0*  --   TROPONINI 0.12* 0.53*  --  1.08*   BNP: Invalid input(s): POCBNP D-Dimer: No results for input(s): DDIMER in the last 72 hours. Hemoglobin A1C:  Recent Labs  12/29/14 1832  HGBA1C 5.1   Fasting Lipid Panel: No results for input(s): CHOL,  HDL, LDLCALC, TRIG, CHOLHDL, LDLDIRECT in the last 72 hours. Thyroid Function Tests: No results for input(s): TSH, T4TOTAL, T3FREE, THYROIDAB in the last 72 hours.  Invalid input(s): FREET3 Anemia Panel: No results for input(s): VITAMINB12, FOLATE, FERRITIN, TIBC, IRON, RETICCTPCT in the last 72 hours.  Dg Chest Portable 1 View  12/29/2014   CLINICAL DATA:  Left-sided chest pain for 2 weeks.  EXAM: PORTABLE CHEST - 1 VIEW  COMPARISON:  None.  FINDINGS: The heart size and mediastinal contours are within normal limits. Both lungs are clear. No evidence of pneumothorax or pleural effusion. The visualized skeletal structures are unremarkable.  IMPRESSION: No active cardiopulmonary disease.   Electronically Signed   By: Earle Gell M.D.   On: 12/29/2014 16:24     Echo    TELEMETRY: Normal sinus rhythm:  ASSESSMENT AND PLAN:  Principal Problem:   Acute non Q wave MI (myocardial infarction), initial episode of care Active Problems:   S/P drug eluting coronary stent placement    1. Continue current therapy 2. Dual antiplatelet therapy uninterrupted for 1 year 3. May discharge home, follow-up in one week   Mignonne Afonso, MD, PhD, [facility] 12/31/2014 8:01 AM

## 2015-01-04 ENCOUNTER — Encounter: Payer: Self-pay | Admitting: Cardiology

## 2015-01-04 MED ORDER — SODIUM CHLORIDE 0.9 % IV SOLN
250.0000 mg | INTRAVENOUS | Status: DC | PRN
  Administered 2014-12-30: 1.75 mg/kg/h via INTRAVENOUS

## 2015-01-18 ENCOUNTER — Other Ambulatory Visit: Payer: Self-pay | Admitting: Family Medicine

## 2015-01-18 ENCOUNTER — Other Ambulatory Visit (HOSPITAL_COMMUNITY): Payer: Self-pay | Admitting: Family Medicine

## 2015-01-18 ENCOUNTER — Ambulatory Visit
Admission: RE | Admit: 2015-01-18 | Discharge: 2015-01-18 | Disposition: A | Discharge status: Home / Self Care | Payer: Medicare Other | Source: Ambulatory Visit | Attending: Family Medicine | Admitting: Family Medicine

## 2015-01-18 DIAGNOSIS — R609 Edema, unspecified: Secondary | ICD-10-CM

## 2015-01-18 DIAGNOSIS — T148 Other injury of unspecified body region: Secondary | ICD-10-CM | POA: Diagnosis not present

## 2015-01-18 DIAGNOSIS — X58XXXA Exposure to other specified factors, initial encounter: Secondary | ICD-10-CM | POA: Insufficient documentation

## 2015-01-18 DIAGNOSIS — T148XXA Other injury of unspecified body region, initial encounter: Secondary | ICD-10-CM

## 2015-01-18 DIAGNOSIS — M7989 Other specified soft tissue disorders: Secondary | ICD-10-CM | POA: Insufficient documentation

## 2015-01-19 ENCOUNTER — Ambulatory Visit: Payer: Medicare Other

## 2015-02-22 DIAGNOSIS — R319 Hematuria, unspecified: Secondary | ICD-10-CM | POA: Insufficient documentation

## 2015-08-02 ENCOUNTER — Emergency Department: Payer: Medicare Other

## 2015-08-02 ENCOUNTER — Encounter: Payer: Self-pay | Admitting: Emergency Medicine

## 2015-08-02 ENCOUNTER — Emergency Department
Admission: EM | Admit: 2015-08-02 | Discharge: 2015-08-02 | Disposition: A | Discharge status: Home / Self Care | Payer: Medicare Other | Attending: Emergency Medicine | Admitting: Emergency Medicine

## 2015-08-02 DIAGNOSIS — K8020 Calculus of gallbladder without cholecystitis without obstruction: Secondary | ICD-10-CM | POA: Insufficient documentation

## 2015-08-02 DIAGNOSIS — R1013 Epigastric pain: Secondary | ICD-10-CM

## 2015-08-02 DIAGNOSIS — Z7902 Long term (current) use of antithrombotics/antiplatelets: Secondary | ICD-10-CM | POA: Insufficient documentation

## 2015-08-02 DIAGNOSIS — Z79899 Other long term (current) drug therapy: Secondary | ICD-10-CM | POA: Diagnosis not present

## 2015-08-02 DIAGNOSIS — R079 Chest pain, unspecified: Secondary | ICD-10-CM | POA: Insufficient documentation

## 2015-08-02 DIAGNOSIS — Z7982 Long term (current) use of aspirin: Secondary | ICD-10-CM | POA: Insufficient documentation

## 2015-08-02 DIAGNOSIS — R109 Unspecified abdominal pain: Secondary | ICD-10-CM | POA: Diagnosis present

## 2015-08-02 LAB — CBC WITH DIFFERENTIAL/PLATELET
BASOS ABS: 0.1 10*3/uL (ref 0–0.1)
Basophils Relative: 1 %
EOS ABS: 0.1 10*3/uL (ref 0–0.7)
EOS PCT: 1 %
HCT: 43.7 % (ref 40.0–52.0)
Hemoglobin: 14.8 g/dL (ref 13.0–18.0)
Lymphocytes Relative: 9 %
Lymphs Abs: 1.3 10*3/uL (ref 1.0–3.6)
MCH: 30.2 pg (ref 26.0–34.0)
MCHC: 34 g/dL (ref 32.0–36.0)
MCV: 89 fL (ref 80.0–100.0)
Monocytes Absolute: 0.9 10*3/uL (ref 0.2–1.0)
Monocytes Relative: 6 %
Neutro Abs: 12.6 10*3/uL — ABNORMAL HIGH (ref 1.4–6.5)
Neutrophils Relative %: 83 %
PLATELETS: 306 10*3/uL (ref 150–440)
RBC: 4.91 MIL/uL (ref 4.40–5.90)
RDW: 13.6 % (ref 11.5–14.5)
WBC: 14.9 10*3/uL — ABNORMAL HIGH (ref 3.8–10.6)

## 2015-08-02 LAB — COMPREHENSIVE METABOLIC PANEL
ALT: 14 U/L — AB (ref 17–63)
AST: 17 U/L (ref 15–41)
Albumin: 4 g/dL (ref 3.5–5.0)
Alkaline Phosphatase: 62 U/L (ref 38–126)
Anion gap: 7 (ref 5–15)
BUN: 13 mg/dL (ref 6–20)
CHLORIDE: 106 mmol/L (ref 101–111)
CO2: 25 mmol/L (ref 22–32)
CREATININE: 1.34 mg/dL — AB (ref 0.61–1.24)
Calcium: 9 mg/dL (ref 8.9–10.3)
GFR calc Af Amer: 56 mL/min — ABNORMAL LOW (ref >60)
GFR calc non Af Amer: 49 mL/min — ABNORMAL LOW (ref >60)
Glucose, Bld: 118 mg/dL — ABNORMAL HIGH (ref 65–99)
POTASSIUM: 3.6 mmol/L (ref 3.5–5.1)
SODIUM: 138 mmol/L (ref 135–145)
Total Bilirubin: 0.9 mg/dL (ref 0.3–1.2)
Total Protein: 7.5 g/dL (ref 6.5–8.1)

## 2015-08-02 LAB — LIPASE, BLOOD: Lipase: 24 U/L (ref 11–51)

## 2015-08-02 LAB — TROPONIN I
Troponin I: <0.03 ng/mL (ref <0.031)
Troponin I: <0.03 ng/mL (ref <0.031)

## 2015-08-02 MED ORDER — FAMOTIDINE 20 MG PO TABS: 20.0000 mg | ORAL_TABLET | Freq: Every day | ORAL | Status: DC

## 2015-08-02 MED ORDER — OXYCODONE-ACETAMINOPHEN 5-325 MG PO TABS
2.0000 | ORAL_TABLET | Freq: Once | ORAL | Status: AC
  Administered 2015-08-02: 2 via ORAL
  Filled 2015-08-02: qty 2

## 2015-08-02 MED ORDER — NITROGLYCERIN 0.4 MG SL SUBL
0.4000 mg | SUBLINGUAL_TABLET | Freq: Once | SUBLINGUAL | Status: AC
  Administered 2015-08-02: 0.4 mg via SUBLINGUAL

## 2015-08-02 MED ORDER — ONDANSETRON HCL 4 MG/2ML IJ SOLN
4.0000 mg | Freq: Once | INTRAMUSCULAR | Status: AC
  Administered 2015-08-02: 4 mg via INTRAVENOUS

## 2015-08-02 MED ORDER — MORPHINE SULFATE (PF) 4 MG/ML IV SOLN
INTRAVENOUS | Status: AC
  Administered 2015-08-02: 4 mg via INTRAVENOUS
  Filled 2015-08-02: qty 1

## 2015-08-02 MED ORDER — FAMOTIDINE 20 MG PO TABS
20.0000 mg | ORAL_TABLET | Freq: Once | ORAL | Status: AC
  Administered 2015-08-02: 20 mg via ORAL
  Filled 2015-08-02: qty 1

## 2015-08-02 MED ORDER — GI COCKTAIL ~~LOC~~
30.0000 mL | Freq: Once | ORAL | Status: AC
  Administered 2015-08-02: 30 mL via ORAL
  Filled 2015-08-02: qty 30

## 2015-08-02 MED ORDER — NITROGLYCERIN 0.4 MG SL SUBL
SUBLINGUAL_TABLET | SUBLINGUAL | Status: AC
  Administered 2015-08-02: 0.4 mg via SUBLINGUAL
  Filled 2015-08-02: qty 1

## 2015-08-02 MED ORDER — SUCRALFATE 1 G PO TABS: 1.0000 g | ORAL_TABLET | Freq: Four times a day (QID) | ORAL | Status: DC

## 2015-08-02 MED ORDER — ONDANSETRON HCL 4 MG/2ML IJ SOLN
INTRAMUSCULAR | Status: AC
  Administered 2015-08-02: 4 mg via INTRAVENOUS
  Filled 2015-08-02: qty 2

## 2015-08-02 MED ORDER — MORPHINE SULFATE (PF) 4 MG/ML IV SOLN
4.0000 mg | Freq: Once | INTRAVENOUS | Status: AC
  Administered 2015-08-02: 4 mg via INTRAVENOUS

## 2015-08-02 MED ORDER — CIPROFLOXACIN HCL 500 MG PO TABS
500.0000 mg | ORAL_TABLET | Freq: Two times a day (BID) | ORAL | Status: AC
Start: 2015-08-02 — End: 2015-08-12

## 2015-08-02 NOTE — ED Provider Notes (Addendum)
IMPRESSION: Gallstones with gallbladder wall thickening and pericholecystic fluid. Findings likely reflect chronic cholecystitis given the lack of a positive sonographic Murphy's sign. Correlation with the patient's white blood cell count and hepatic function studies would be useful. The liver and common bile duct are unremarkable.   Patient does not have any right upper quadrant tenderness, mild epigastric discomfort but no tenderness on exam. I feel his gallstones are probably unrelated to his current symptoms. Patient does not have any chest pain, repeat troponin is negative. I will place him on Pepcid, pain medication and outpatient general surgery referral. I have discussed this through the [place] with Dr. Adonis Huguenin.  Earleen Newport, MD 08/02/15 [place], MD 08/02/15 [phone]

## 2015-08-02 NOTE — ED Notes (Signed)
Patient transported to Ultrasound 

## 2015-08-02 NOTE — Discharge Instructions (Signed)
Cholelithiasis Cholelithiasis (also called gallstones) is a form of gallbladder disease in which gallstones form in your gallbladder. The gallbladder is an organ that stores bile made in the liver, which helps digest fats. Gallstones begin as small crystals and slowly grow into stones. Gallstone pain occurs when the gallbladder spasms and a gallstone is blocking the duct. Pain can also occur when a stone passes out of the duct.  RISK FACTORS  Being male.   Having multiple pregnancies. Health care providers sometimes advise removing diseased gallbladders before future pregnancies.   Being obese.  Eating a diet heavy in fried foods and fat.   Being older than 17 years and increasing age.   Prolonged use of medicines containing male hormones.   Having diabetes mellitus.   Rapidly losing weight.   Having a family history of gallstones (heredity).  SYMPTOMS  Nausea.   Vomiting.  Abdominal pain.   Yellowing of the skin (jaundice).   Sudden pain. It may persist from several minutes to several hours.  Fever.   Tenderness to the touch. In some cases, when gallstones do not move into the bile duct, people have no pain or symptoms. These are called "silent" gallstones.  TREATMENT Silent gallstones do not need treatment. In severe cases, emergency surgery may be required. Options for treatment include:  Surgery to remove the gallbladder. This is the most common treatment.  Medicines. These do not always work and may take 6-12 months or more to work.  Shock wave treatment (extracorporeal biliary lithotripsy). In this treatment an ultrasound machine sends shock waves to the gallbladder to break gallstones into smaller pieces that can pass into the intestines or be dissolved by medicine. HOME CARE INSTRUCTIONS   Only take over-the-counter or prescription medicines for pain, discomfort, or fever as directed by your health care provider.   Follow a low-fat diet until  seen again by your health care provider. Fat causes the gallbladder to contract, which can result in pain.   Follow up with your health care provider as directed. Attacks are almost always recurrent and surgery is usually required for permanent treatment.  SEEK IMMEDIATE MEDICAL CARE IF:   Your pain increases and is not controlled by medicines.   You have a fever or persistent symptoms for more than 2-3 days.   You have a fever and your symptoms suddenly get worse.   You have persistent nausea and vomiting.  MAKE SURE YOU:   Understand these instructions.  Will watch your condition.  Will get help right away if you are not doing well or get worse.   This information is not intended to replace advice given to you by your health care provider. Make sure you discuss any questions you have with your health care provider.   Document Released: 06/21/2005 Document Revised: 02/25/2013 Document Reviewed: 12/17/2012 Elsevier Interactive Patient Education 2016 [place]. Gastroesophageal Reflux Disease, Adult Normally, food travels down the esophagus and stays in the stomach to be digested. However, when a person has gastroesophageal reflux disease (GERD), food and stomach acid move back up into the esophagus. When this happens, the esophagus becomes sore and inflamed. Over time, GERD can create small holes (ulcers) in the lining of the esophagus.  CAUSES This condition is caused by a problem with the muscle between the esophagus and the stomach (lower esophageal sphincter, or LES). Normally, the LES muscle closes after food passes through the esophagus to the stomach. When the LES is weakened or abnormal, it does not close properly,  and that allows food and stomach acid to go back up into the esophagus. The LES can be weakened by certain dietary substances, medicines, and medical conditions, including:  Tobacco use.  Pregnancy.  Having a hiatal hernia.  Heavy alcohol  use.  Certain foods and beverages, such as coffee, chocolate, onions, and peppermint. RISK FACTORS This condition is more likely to develop in:  People who have an increased body weight.  People who have connective tissue disorders.  People who use NSAID medicines. SYMPTOMS Symptoms of this condition include:  Heartburn.  Difficult or painful swallowing.  The feeling of having a lump in the throat.  Abitter taste in the mouth.  Bad breath.  Having a large amount of saliva.  Having an upset or bloated stomach.  Belching.  Chest pain.  Shortness of breath or wheezing.  Ongoing (chronic) cough or a night-time cough.  Wearing away of tooth enamel.  Weight loss. Different conditions can cause chest pain. Make sure to see your health care provider if you experience chest pain. DIAGNOSIS Your health care provider will take a medical history and perform a physical exam. To determine if you have mild or severe GERD, your health care provider may also monitor how you respond to treatment. You may also have other tests, including:  An endoscopy toexamine your stomach and esophagus with a small camera.  A test thatmeasures the acidity level in your esophagus.  A test thatmeasures how much pressure is on your esophagus.  A barium swallow or modified barium swallow to show the shape, size, and functioning of your esophagus. TREATMENT The goal of treatment is to help relieve your symptoms and to prevent complications. Treatment for this condition may vary depending on how severe your symptoms are. Your health care provider may recommend:  Changes to your diet.  Medicine.  Surgery. HOME CARE INSTRUCTIONS Diet  Follow a diet as recommended by your health care provider. This may involve avoiding foods and drinks such as:  Coffee and tea (with or without caffeine).  Drinks that containalcohol.  Energy drinks and sports drinks.  Carbonated drinks or  sodas.  Chocolate and cocoa.  Peppermint and mint flavorings.  Garlic and onions.  Horseradish.  Spicy and acidic foods, including peppers, chili powder, curry powder, vinegar, hot sauces, and barbecue sauce.  Citrus fruit juices and citrus fruits, such as oranges, lemons, and limes.  Tomato-based foods, such as red sauce, chili, salsa, and pizza with red sauce.  Fried and fatty foods, such as donuts, french fries, potato chips, and high-fat dressings.  High-fat meats, such as hot dogs and fatty cuts of red and white meats, such as rib eye steak, sausage, ham, and bacon.  High-fat dairy items, such as whole milk, butter, and cream cheese.  Eat small, frequent meals instead of large meals.  Avoid drinking large amounts of liquid with your meals.  Avoid eating meals during the 2-3 hours before bedtime.  Avoid lying down right after you eat.  Do not exercise right after you eat. General Instructions  Pay attention to any changes in your symptoms.  Take over-the-counter and prescription medicines only as told by your health care provider. Do not take aspirin, ibuprofen, or other NSAIDs unless your health care provider told you to do so.  Do not use any tobacco products, including cigarettes, chewing tobacco, and e-cigarettes. If you need help quitting, ask your health care provider.  Wear loose-fitting clothing. Do not wear anything tight around your waist that causes pressure  on your abdomen.  Raise (elevate) the head of your bed 6 inches (15cm).  Try to reduce your stress, such as with yoga or meditation. If you need help reducing stress, ask your health care provider.  If you are overweight, reduce your weight to an amount that is healthy for you. Ask your health care provider for guidance about a safe weight loss goal.  Keep all follow-up visits as told by your health care provider. This is important. SEEK MEDICAL CARE IF:  You have new symptoms.  You have  unexplained weight loss.  You have difficulty swallowing, or it hurts to swallow.  You have wheezing or a persistent cough.  Your symptoms do not improve with treatment.  You have a hoarse voice. SEEK IMMEDIATE MEDICAL CARE IF:  You have pain in your arms, neck, jaw, teeth, or back.  You feel sweaty, dizzy, or light-headed.  You have chest pain or shortness of breath.  You vomit and your vomit looks like blood or coffee grounds.  You faint.  Your stool is bloody or black.  You cannot swallow, drink, or eat.   This information is not intended to replace advice given to you by your health care provider. Make sure you discuss any questions you have with your health care provider.   Document Released: 04/04/2005 Document Revised: 03/16/2015 Document Reviewed: 10/20/2014 Elsevier Interactive Patient Education [employer].

## 2015-08-02 NOTE — ED Provider Notes (Signed)
[facility] Emergency Department Provider Note  ____________________________________________  Time seen: Approximately 546 AM  I have reviewed the triage vital signs and the nursing notes.   HISTORY  Chief Complaint Abdominal Pain    HPI Michael Brennan is a 82 y.o. male who comes into the hospital today with abdominal pain. He reports that the first episode he had on Sunday and it lasted about 3 hours. He reports it went away but then came again on Monday. The patient reports it is been there since then for approximately 12 hours. The patient reports that he had eaten salmon cheese and bread for dinner and had some tea. The pain is in his upper abdomen/lower chest and it does not have any radiation. The patient reports that the entire area is sore and his pain is 8 out of 10 in intensity. The patient reports that he has never had this pain before. The patient did have an MI in June and he reports that he has been on Plavix since then. The patient thinks this is due to the Plavix because one of the side effects of Plavix is abdominal pain. The patient reports he stopped taking his Plavix on Sunday and that he feels he has been on it long enough and it's only causing poor side effects. The patient is supposed to see Dr. Saralyn Pilar on Monday.The patient reports that he is here to find out what's causing his pain. He denies any nausea or vomiting denies any pain into his chest and denies any shortness of breath.   Past Medical History  Diagnosis Date  . NSTEMI (non-ST elevated myocardial infarction) [facility])     Patient Active Problem List   Diagnosis Date Noted  . S/P drug eluting coronary stent placement 12/31/2014  . Acute non Q wave MI (myocardial infarction), initial episode of care [facility]) 12/29/2014    Past Surgical History  Procedure Laterality Date  . Cardiac catheterization N/A 12/30/2014    Procedure: Left Heart Cath;  Surgeon: Isaias Cowman, MD;   Location: [place] [facility];  Service: Cardiovascular;  Laterality: N/A;    Current Outpatient Rx  Name  Route  Sig  Dispense  Refill  . aspirin (ASPIRIN CHILDRENS) 81 MG chewable tablet   Oral   Chew 1 tablet (81 mg total) by mouth daily.   120 tablet   0   . atorvastatin (LIPITOR) 40 MG tablet   Oral   Take 1 tablet (40 mg total) by mouth daily at 6 PM.   30 tablet   0   . clopidogrel (PLAVIX) 75 MG tablet   Oral   Take 1 tablet (75 mg total) by mouth daily with breakfast.   30 tablet   0   . metoprolol tartrate (LOPRESSOR) 25 MG tablet   Oral   Take 1 tablet (25 mg total) by mouth 2 (two) times daily.   60 tablet   0   . nitroGLYCERIN (NITROGLYN) 2 % ointment   Topical   Apply 1 inch topically every 6 (six) hours.   30 g   0   . nitroGLYCERIN (NITROSTAT) 0.4 MG SL tablet   Sublingual   Place 1 tablet (0.4 mg total) under the tongue every 5 (five) minutes as needed for chest pain (if having Chest pain after 3 doses call MD/911).   30 tablet   0   . ondansetron (ZOFRAN) 4 MG tablet   Oral   Take 1 tablet (4 mg total) by mouth  every 6 (six) hours as needed for nausea.   20 tablet   0   . ondansetron (ZOFRAN) 4 MG/2ML SOLN injection   Intravenous   Inject 2 mLs (4 mg total) into the vein every 6 (six) hours as needed for nausea.   2 mL   0     Allergies Review of patient's allergies indicates no known allergies.  No family history on file.  Social History Social History  Substance Use Topics  . Smoking status: Never Smoker   . Smokeless tobacco: Never Used  . Alcohol Use: No    Review of Systems Constitutional: No fever/chills Eyes: No visual changes. ENT: No sore throat. Cardiovascular:  chest pain. Respiratory: Denies shortness of breath. Gastrointestinal:  abdominal pain.  No nausea, no vomiting.  No diarrhea.  No constipation. Genitourinary: Negative for dysuria. Musculoskeletal: Negative for back pain. Skin: Negative for  rash. Neurological: Negative for headaches, focal weakness or numbness.  10-point ROS otherwise negative.  ____________________________________________   PHYSICAL EXAM:  VITAL SIGNS: ED Triage Vitals  Enc Vitals Group     BP 08/02/15 0329 145/89 mmHg     Pulse Rate 08/02/15 0329 96     Resp 08/02/15 0329 18     Temp 08/02/15 0329 98.5 F (36.9 C)     Temp Source 08/02/15 0329 Oral     SpO2 08/02/15 0329 98 %     Weight 08/02/15 0329 167 lb (75.751 kg)     Height 08/02/15 0329 5\' 7"  (1.702 m)     Head Cir --      Peak Flow --      Pain Score 08/02/15 0330 5     Pain Loc --      Pain Edu? --      Excl. in [place]? --     Constitutional: Alert and oriented. Well appearing and in mild distress. Eyes: Conjunctivae are normal. PERRL. EOMI. Head: Atraumatic. Nose: No congestion/rhinnorhea. Mouth/Throat: Mucous membranes are moist.  Oropharynx non-erythematous. Cardiovascular: Normal rate, regular rhythm. Grossly normal heart sounds.  Good peripheral circulation. Respiratory: Normal respiratory effort.  No retractions. Lungs CTAB. Gastrointestinal: Soft and nontender. No distention. Positive bowel sounds Musculoskeletal: No lower extremity tenderness nor edema.   Neurologic:  Normal speech and language.  Skin:  Skin is warm, dry and intact.  Psychiatric: Mood and affect are normal.  ____________________________________________   LABS (all labs ordered are listed, but only abnormal results are displayed)  Labs Reviewed  CBC WITH DIFFERENTIAL/PLATELET - Abnormal; Notable for the following:    WBC 14.9 (*)    Neutro Abs 12.6 (*)    All other components within normal limits  COMPREHENSIVE METABOLIC PANEL - Abnormal; Notable for the following:    Glucose, Bld 118 (*)    Creatinine, Ser 1.34 (*)    ALT 14 (*)    GFR calc non Af Amer 49 (*)    GFR calc Af Amer 56 (*)    All other components within normal limits  TROPONIN I  LIPASE, BLOOD    ____________________________________________  EKG  ED ECG REPORT I, Loney Hering, the attending physician, personally viewed and interpreted this ECG.   Date: 08/02/2015  EKG Time: 333  Rate: 94  Rhythm: normal sinus rhythm  Axis: left axis deviation  Intervals:left anterior fascicular block  ST&T Change: none  ____________________________________________  RADIOLOGY  Chest x-ray: No acute cardiopulmonary process seen ____________________________________________   PROCEDURES  Procedure(s) performed: None  Critical Care performed: No  ____________________________________________  INITIAL IMPRESSION / ASSESSMENT AND PLAN / ED COURSE  Pertinent labs & imaging results that were available during my care of the patient were reviewed by me and considered in my medical decision making (see chart for details).  This is a 82 year old male who comes in with upper abdominal pain as well as lower chest pain. The patient's chest x-ray is unremarkable as well as his troponin and lipase. I will give the patient a GI cocktail and also in the patient for an ultrasound of his right upper quadrant. The patient feels that this is caused by his Plavix. I will reassess the patient once he received his medication and his ultrasound and help determine the cause of his symptoms.  The patient still had pain after his GI cocktail so he will receive an ultrasound as well as a repeat troponin. I did give the patient dose of morphine and Zofran. The patient's care was signed out to Dr. Jimmye Norman who will follow-up the results of the ultrasound as well as a repeat troponin. ____________________________________________   FINAL CLINICAL IMPRESSION(S) / ED DIAGNOSES  Final diagnoses:  None      Loney Hering, MD 08/02/15 [phone]

## 2015-08-02 NOTE — ED Notes (Signed)
Pt states he did not have any relief with the SL nitro.Michael Brennan

## 2015-08-02 NOTE — ED Notes (Signed)
Pt presents to ED with contsant sharp epigastric pain that started Sunday night. Pt reports it has continued and slightly improved. Has hx of MI and states he had no symptoms during his heart attack previously and he just wants to make sure everything is ok. Pt reports he is currently on Plavix and wants to make sure he does not have an ulcer. Denies rectal bleeding, n/v, chest pain, or shortness of breath. Appears lightly anxious. No increased work of breathing noted at this time. Pt alert and talkative; skin warm and dry.

## 2015-08-02 NOTE — ED Notes (Signed)
Pt returned from US

## 2015-08-09 ENCOUNTER — Ambulatory Visit (INDEPENDENT_AMBULATORY_CARE_PROVIDER_SITE_OTHER): Payer: Self-pay | Admitting: Surgery

## 2015-08-09 ENCOUNTER — Encounter: Payer: Self-pay | Admitting: Surgery

## 2015-08-09 ENCOUNTER — Other Ambulatory Visit
Admission: RE | Admit: 2015-08-09 | Discharge: 2015-08-09 | Disposition: A | Discharge status: Home / Self Care | Payer: Medicare Other | Source: Ambulatory Visit | Attending: Surgery | Admitting: Surgery

## 2015-08-09 VITALS — BP 149/71 | HR 70 | Temp 97.8°F | Wt 171.0 lb

## 2015-08-09 DIAGNOSIS — K8020 Calculus of gallbladder without cholecystitis without obstruction: Secondary | ICD-10-CM | POA: Insufficient documentation

## 2015-08-09 DIAGNOSIS — K8050 Calculus of bile duct without cholangitis or cholecystitis without obstruction: Secondary | ICD-10-CM

## 2015-08-09 DIAGNOSIS — I2510 Atherosclerotic heart disease of native coronary artery without angina pectoris: Secondary | ICD-10-CM

## 2015-08-09 LAB — COMPREHENSIVE METABOLIC PANEL
ALT: 48 U/L (ref 17–63)
ANION GAP: 9 (ref 5–15)
AST: 40 U/L (ref 15–41)
Albumin: 3 g/dL — ABNORMAL LOW (ref 3.5–5.0)
Alkaline Phosphatase: 160 U/L — ABNORMAL HIGH (ref 38–126)
BUN: 16 mg/dL (ref 6–20)
CO2: 26 mmol/L (ref 22–32)
CREATININE: 1.23 mg/dL (ref 0.61–1.24)
Calcium: 8.7 mg/dL — ABNORMAL LOW (ref 8.9–10.3)
Chloride: 104 mmol/L (ref 101–111)
GFR calc non Af Amer: 54 mL/min — ABNORMAL LOW (ref >60)
Glucose, Bld: 100 mg/dL — ABNORMAL HIGH (ref 65–99)
Potassium: 4 mmol/L (ref 3.5–5.1)
Sodium: 139 mmol/L (ref 135–145)
TOTAL PROTEIN: 7.1 g/dL (ref 6.5–8.1)
Total Bilirubin: 0.8 mg/dL (ref 0.3–1.2)

## 2015-08-09 LAB — CBC WITH DIFFERENTIAL/PLATELET
Basophils Absolute: 0.1 10*3/uL (ref 0–0.1)
Basophils Relative: 1 %
Eosinophils Absolute: 0.4 10*3/uL (ref 0–0.7)
Eosinophils Relative: 3 %
HCT: 37 % — ABNORMAL LOW (ref 40.0–52.0)
HEMOGLOBIN: 12.5 g/dL — AB (ref 13.0–18.0)
LYMPHS ABS: 1.5 10*3/uL (ref 1.0–3.6)
Lymphocytes Relative: 12 %
MCH: 29.9 pg (ref 26.0–34.0)
MCHC: 33.7 g/dL (ref 32.0–36.0)
MCV: 88.7 fL (ref 80.0–100.0)
MONOS PCT: 9 %
Monocytes Absolute: 1.1 10*3/uL — ABNORMAL HIGH (ref 0.2–1.0)
NEUTROS ABS: 9 10*3/uL — AB (ref 1.4–6.5)
NEUTROS PCT: 75 %
Platelets: 488 10*3/uL — ABNORMAL HIGH (ref 150–440)
RBC: 4.17 MIL/uL — ABNORMAL LOW (ref 4.40–5.90)
RDW: 13.7 % (ref 11.5–14.5)
WBC: 12 10*3/uL — ABNORMAL HIGH (ref 3.8–10.6)

## 2015-08-09 NOTE — Progress Notes (Signed)
Surgical Consultation  08/09/2015  Michael Brennan is an 82 y.o. male.   CC: Upper abdominal pain  HPI: Patient with a history of upper abdominal pain and workup showing gallstones and/or sludge with some pericholecystic fluid on ultrasound but a negative Murphy sign. He was in the emergency room recently with a second episode of this. He is currently on Plavix following stent placement in June 2016 for an non-ST wave MI.  She does not want to be on Plavix and has stopped his Lipitor as he thought that was causing him some problems.  He has had off and on hematuria and points to the substernal area as opposed to the abdomen as the point of maximal pain during his episode. His pain is completely resolved he has no nausea vomiting but he has been having fevers that stopped 2 days ago up to 101.5 and he has tracked his temperature curve multiple times per day  on his eye phone .he had a colonoscopy but it was way back in the 1980s    he is a nonsmoker nondrinker and is a retired [employer]      Past Medical History  Diagnosis Date  . NSTEMI (non-ST elevated myocardial infarction) [facility])     Past Surgical History  Procedure Laterality Date  . Cardiac catheterization N/A 12/30/2014    Procedure: Left Heart Cath;  Surgeon: Isaias Cowman, MD;  Location: [place] [facility];  Service: Cardiovascular;  Laterality: N/A;    Family History  Problem Relation Age of Onset  . Alcohol abuse Neg Hx   . Arthritis Neg Hx   . Asthma Neg Hx   . Birth defects Neg Hx   . Cancer Neg Hx   . COPD Neg Hx   . Depression Neg Hx   . Drug abuse Neg Hx   . Diabetes Neg Hx   . Early death Neg Hx   . Hearing loss Neg Hx   . Heart disease Neg Hx   . Hyperlipidemia Neg Hx   . Hypertension Neg Hx   . Kidney disease Neg Hx   . Learning disabilities Neg Hx   . Mental illness Neg Hx   . Mental retardation Neg Hx   . Miscarriages / Stillbirths Neg Hx   . Stroke Neg Hx   . Vision loss Neg Hx    . Varicose Veins Neg Hx     Social History:  reports that he has never smoked. He has never used smokeless tobacco. He reports that he does not drink alcohol or use illicit drugs.  Allergies: No Known Allergies  Medications reviewed.   Review of Systems:   Review of Systems  Constitutional: Positive for fever, chills and diaphoresis. Negative for weight loss.       Night sweats Fevers as recently as 2 days ago plotted on his eye phone  HENT: Negative.   Eyes: Negative.   Respiratory: Negative.   Cardiovascular: Positive for chest pain. Negative for palpitations, orthopnea, claudication and leg swelling.  Gastrointestinal: Positive for abdominal pain. Negative for heartburn, nausea, vomiting, diarrhea, constipation, blood in stool and melena.  Genitourinary: Positive for hematuria. Negative for dysuria and urgency.  Musculoskeletal: Negative.   Skin: Negative.   Neurological: Negative.   Endo/Heme/Allergies: Negative.   Psychiatric/Behavioral: Negative.      Physical Exam:  BP 149/71 mmHg  Pulse 70  Temp(Src) 97.8 F (36.6 C) (Oral)  Wt 171 lb (77.565 kg)  Physical Exam  Constitutional: He is oriented to person,  place, and time and well-developed, well-nourished, and in no distress. No distress.  HENT:  Head: Normocephalic and atraumatic.  Eyes: Pupils are equal, round, and reactive to light. Right eye exhibits no discharge. Left eye exhibits no discharge. No scleral icterus.  Neck: Normal range of motion.  Cardiovascular: Normal rate, regular rhythm and normal heart sounds.   No murmur heard. Pulmonary/Chest: Effort normal and breath sounds normal. No respiratory distress. He has no wheezes. He has no rales.  Abdominal: Soft. He exhibits no distension. There is no tenderness. There is no rebound and no guarding.  Musculoskeletal: Normal range of motion. He exhibits no edema.  Lymphadenopathy:    He has no cervical adenopathy.  Neurological: He is alert and  oriented to person, place, and time.  Skin: Skin is warm and dry. He is not diaphoretic. No erythema.  Psychiatric: Mood and affect normal.  Vitals reviewed.     No results found for this or any previous visit (from the past 48 hour(s)). No results found.  Assessment/Plan:  This patient's condition likely represents biliary colic I see no sign of acute cholecystitis. He is 7 months status post drug-eluting stent placement for an non-ST wave MI. Surgical risks are still quite high at this point.  Patient has absolutely no abdominal pain today and has not had any abdominal pain since being in the emergency room. He does not have a Murphy sign. And did not have a sonographic Murphy sign at the time of his evaluation with some August the in the emergency room. The etiology of his pain which has completely resolved is unclear to me while it may be gallbladder disease it would be very unlikely for him to have high fevers with no abdominal tenderness and his fevers have persisted longer than his abdominal pain. This does not likely represent acute cholecystitis.  I have suggested that he follow-up with his primary care physician for further workup I will not start him on antibiotics at this point. To repeat is laboratory values including CBC and LFTs. And he should have a referral to a GI physician for possible colonoscopy.  Again the etiology of this abdominal pain is unclear and while it could be gallbladder disease it is unlikely that his fevers are related and that needs to be worked up as well as he is still having night sweats.  I reminded him of the need to be on aspirin and Plavix as Dr. Saralyn Pilar had recommended. I suggested that he start right away as I have no surgical suggestions at this time I also suggested that he see his primary care physician for workup of his fevers and night sweats and see a gastroenterologist for a probable colonoscopy at some point. We will check CBC and a CMP  today. I'll him with those results. Florene Glen, MD, FACS

## 2015-08-11 ENCOUNTER — Telehealth: Payer: Self-pay | Admitting: Surgery

## 2015-08-11 NOTE — Telephone Encounter (Signed)
Called patient back to let him know that Dr. Burt Knack does not believe that it's his gallbladder causing his abdominal pain. Per Dr. Burt Knack, he wanted me to tell patient to call his PCP (Dr. Lovie Macadamia) for him to be seen in reference to his night sweats and fevers.  I told patient that I went ahead and did him the favor of scheduling him the appointment with Dr. Lovie Macadamia for tomorrow 08/12/2015 at 10:15 AM. Patient agreed with appointment. I also told patient that if he needed anything else, to please call me back. Patient understood.  I will be faxing Dr. Lovie Macadamia patient's labs and office notes from 08/09/2015 to [phone].

## 2015-08-11 NOTE — Telephone Encounter (Signed)
Called patient back in reference to his lab results. I told patient that I showed Dr. Burt Knack his lab results and he stated that they were okay. However, Dr. Burt Knack wanted me to remind patient to see his primary care doctor for his night sweats. In addition, I scheduled an appointment with Dr. Allen Norris in reference to his GI issues. I gave this appointment to patient while I had him on the phone. Patient agreed. Patient also wanted me to know that the day before yesterday he started to have right upper abdominal pain. He doesn't have any nausea or vomiting at this time. He also stated that when he breathes in, he feels a stabbing pain but has not taken any medication to ease his pain. He also stated that after eating, doesn't make the pain worse either. Patient also wanted me to know that he thinks that there might be a leakage on his bowels because he noticed his stools being green in color. Patient wants me to ask Dr. Burt Knack what to do at this time.

## 2015-08-11 NOTE — Telephone Encounter (Signed)
Patient is waiting for results °

## 2015-08-17 ENCOUNTER — Other Ambulatory Visit: Payer: Self-pay

## 2015-08-18 ENCOUNTER — Ambulatory Visit (INDEPENDENT_AMBULATORY_CARE_PROVIDER_SITE_OTHER): Payer: Self-pay | Admitting: Gastroenterology

## 2015-08-18 ENCOUNTER — Other Ambulatory Visit: Payer: Self-pay

## 2015-08-18 ENCOUNTER — Encounter: Payer: Self-pay | Admitting: Gastroenterology

## 2015-08-18 VITALS — BP 174/79 | HR 90 | Temp 97.9°F | Ht 66.0 in | Wt 167.0 lb

## 2015-08-18 DIAGNOSIS — I2510 Atherosclerotic heart disease of native coronary artery without angina pectoris: Secondary | ICD-10-CM

## 2015-08-18 DIAGNOSIS — G8929 Other chronic pain: Secondary | ICD-10-CM

## 2015-08-18 DIAGNOSIS — R61 Generalized hyperhidrosis: Secondary | ICD-10-CM

## 2015-08-18 DIAGNOSIS — R1013 Epigastric pain: Secondary | ICD-10-CM

## 2015-08-18 NOTE — Progress Notes (Signed)
Gastroenterology Consultation  Referring Provider:     Juluis Pitch, MD Primary Care Physician:  Juluis Pitch, MD Primary Gastroenterologist:  Dr. Allen Norris     Reason for Consultation:     Night sweats with abdominal pain        HPI:   Michael Brennan is a 82 y.o. y/o male referred for consultation & management of night sweats and abdominal pain by Dr. Juluis Pitch, MD.  This patient comes in today with a report of symptoms starting back in January. The patient states that he had abdominal pain in the right side that was also the epigastric area. The patient states that he stopped his Plavix despite having a stent placed 7 months ago. He did this for probably one week because he thought he may need surgery and went to the emergency department. In the emergency department the patient was found to have a thickened gallbladder wall with gallstones. The patient was also found to have an elevated white cell count. The patient was put on multiple medications as reported by him and he states he stopped most of them after finding out how many side effects they potentially could have. The patient has continued to have night sweats and his most recent lab work showed his white cell count went from 14.9 down to 12. During the same time the patient's hemoglobin went down to 12.5 from being normal. He reports that his abdominal pain has resolved for the most part but he does report that he has had a strange color to his stools in the recent past. She has gone back on his Plavix and is on aspirin.   Past Medical History  Diagnosis Date  . NSTEMI (non-ST elevated myocardial infarction) [facility])     Past Surgical History  Procedure Laterality Date  . Cardiac catheterization N/A 12/30/2014    Procedure: Left Heart Cath;  Surgeon: Isaias Cowman, MD;  Location: [place] [facility];  Service: Cardiovascular;  Laterality: N/A;    Prior to Admission medications   Medication Sig Start Date End Date  Taking? Authorizing Provider  aspirin 81 MG chewable tablet Chew by mouth. 12/31/14  Yes Historical Provider, MD  clopidogrel (PLAVIX) 75 MG tablet Take 1 tablet (75 mg total) by mouth daily with breakfast. 12/31/14  Yes Bettey Costa, MD  atorvastatin (LIPITOR) 40 MG tablet Take 1 tablet (40 mg total) by mouth daily at 6 PM. Patient not taking: Reported on 08/18/2015 12/31/14   Bettey Costa, MD  metoprolol tartrate (LOPRESSOR) 25 MG tablet Take 1 tablet (25 mg total) by mouth 2 (two) times daily. Patient not taking: Reported on 08/18/2015 12/31/14   Bettey Costa, MD  nitroGLYCERIN (NITROGLYN) 2 % ointment Apply 1 inch topically every 6 (six) hours. Patient not taking: Reported on 08/18/2015 12/31/14   Bettey Costa, MD  nitroGLYCERIN (NITROSTAT) 0.4 MG SL tablet Place 1 tablet (0.4 mg total) under the tongue every 5 (five) minutes as needed for chest pain (if having Chest pain after 3 doses call MD/911). Patient not taking: Reported on 08/18/2015 12/31/14   Bettey Costa, MD    Family History  Problem Relation Age of Onset  . Alcohol abuse Neg Hx   . Arthritis Neg Hx   . Asthma Neg Hx   . Birth defects Neg Hx   . Cancer Neg Hx   . COPD Neg Hx   . Depression Neg Hx   . Drug abuse Neg Hx   . Diabetes Neg Hx   .  Early death Neg Hx   . Hearing loss Neg Hx   . Heart disease Neg Hx   . Hyperlipidemia Neg Hx   . Hypertension Neg Hx   . Kidney disease Neg Hx   . Learning disabilities Neg Hx   . Mental illness Neg Hx   . Mental retardation Neg Hx   . Miscarriages / Stillbirths Neg Hx   . Stroke Neg Hx   . Vision loss Neg Hx   . Varicose Veins Neg Hx      Social History  Substance Use Topics  . Smoking status: Never Smoker   . Smokeless tobacco: Never Used  . Alcohol Use: No    Allergies as of 08/18/2015  . (No Known Allergies)    Review of Systems:    All systems reviewed and negative except where noted in HPI.   Physical Exam:  BP 174/79 mmHg  Pulse 90  Temp(Src) 97.9 F (36.6 C) (Oral)  Ht  5\' 6"  (1.676 m)  Wt 167 lb (75.751 kg)  BMI 26.97 kg/m2 No LMP for male patient. Psych:  Alert and cooperative. Normal mood and affect. General:   Alert,  Well-developed, well-nourished, pleasant and cooperative in NAD Head:  Normocephalic and atraumatic. Eyes:  Sclera clear, no icterus.   Conjunctiva pink. Ears:  Normal auditory acuity. Nose:  No deformity, discharge, or lesions. Mouth:  No deformity or lesions,oropharynx pink & moist. Neck:  Supple; no masses or thyromegaly. Lungs:  Respirations even and unlabored.  Clear throughout to auscultation.   No wheezes, crackles, or rhonchi. No acute distress. Heart:  Regular rate and rhythm; no murmurs, clicks, rubs, or gallops. Abdomen:  Normal bowel sounds.  No bruits.  Soft, non-tender and non-distended without masses, hepatosplenomegaly or hernias noted.  No guarding or rebound tenderness.  Negative Carnett sign.   Rectal:  Deferred.  Msk:  Symmetrical without gross deformities.  Good, equal movement & strength bilaterally. Pulses:  Normal pulses noted. Extremities:  No clubbing or edema.  No cyanosis. Neurologic:  Alert and oriented x3;  grossly normal neurologically. Skin:  Intact without significant lesions or rashes.  No jaundice. Lymph Nodes:  No significant cervical adenopathy. Psych:  Alert and cooperative. Normal mood and affect.  Imaging Studies: Dg Chest 2 View  08/02/2015  CLINICAL DATA:  Acute onset of cough and fatigue. Sternal chest and epigastric pain. Initial encounter. EXAM: CHEST  2 VIEW COMPARISON:  Chest radiograph performed 12/29/2014 FINDINGS: The lungs are well-aerated and clear. There is no evidence of focal opacification, pleural effusion or pneumothorax. The heart is normal in size; the mediastinal contour is within normal limits. No acute osseous abnormalities are seen. IMPRESSION: No acute cardiopulmonary process seen. Electronically Signed   By: Garald Balding M.D.   On: 08/02/2015 04:27   US Abdomen Limited  Ruq  08/02/2015  CLINICAL DATA:  13 hours of epigastric pain EXAM: US ABDOMEN LIMITED - RIGHT UPPER QUADRANT COMPARISON:  None impacts FINDINGS: Gallbladder: The gallbladder is mildly distended. There are multiple gallstones as well as sludge. There is gallbladder wall thickening to 6.9 mm. There small amount of pericholecystic fluid. There is no positive sonographic Murphy's sign. Common bile duct: Diameter: 2.8 mm where visualized. Liver: The liver exhibits normal echotexture. There is no focal mass nor ductal dilation. No ascites is demonstrated surrounding the liver. IMPRESSION: Gallstones with gallbladder wall thickening and pericholecystic fluid. Findings likely reflect chronic cholecystitis given the lack of a positive sonographic Murphy's sign. Correlation with the patient's white blood  cell count and hepatic function studies would be useful. The liver and common bile duct are unremarkable. Electronically Signed   By: David  Martinique M.D.   On: 08/02/2015 08:56    Assessment and Plan:   Michael Brennan is a 82 y.o. y/o male who comes in today with a history of abdominal pain that was epigastric and right sided. The patient was seen by surgery and he reports that they thought his gallbladder was not the problem. The patient had an ultrasound that showed pericholecystic fluid with a thickened gallbladder wall. Due to the patient's lack of any endoscopic procedure for over 20 years and his night sweats with epigastric pain the patient will be set up for an EGD and colonoscopy. The patient has been told that these will be done while he is still on his Plavix and aspirin due to the risk of his stent thrombosing. If anything is found the patient has been told that he may need a repeat colonoscopy when he is off the Plavix after the stent has been in for a year. The patient has been explained the plan and agrees with it.I have discussed risks & benefits which include, but are not limited to, bleeding,  infection, perforation & drug reaction.  The patient agrees with this plan & written consent will be obtained.     Note: This dictation was prepared with Dragon dictation along with smaller phrase technology. Any transcriptional errors that result from this process are unintentional.

## 2015-08-22 ENCOUNTER — Encounter: Payer: Self-pay | Admitting: *Deleted

## 2015-08-24 NOTE — Discharge Instructions (Signed)

## 2015-08-25 ENCOUNTER — Encounter
Admission: RE | Disposition: A | Discharge status: Home / Self Care | Payer: Self-pay | Source: Ambulatory Visit | Attending: Gastroenterology

## 2015-08-25 ENCOUNTER — Ambulatory Visit: Payer: Medicare Other | Admitting: Anesthesiology

## 2015-08-25 ENCOUNTER — Ambulatory Visit
Admission: RE | Admit: 2015-08-25 | Discharge: 2015-08-25 | Disposition: A | Discharge status: Home / Self Care | Payer: Medicare Other | Source: Ambulatory Visit | Attending: Gastroenterology | Admitting: Gastroenterology

## 2015-08-25 DIAGNOSIS — Z7982 Long term (current) use of aspirin: Secondary | ICD-10-CM | POA: Insufficient documentation

## 2015-08-25 DIAGNOSIS — I2510 Atherosclerotic heart disease of native coronary artery without angina pectoris: Secondary | ICD-10-CM | POA: Insufficient documentation

## 2015-08-25 DIAGNOSIS — K209 Esophagitis, unspecified without bleeding: Secondary | ICD-10-CM | POA: Insufficient documentation

## 2015-08-25 DIAGNOSIS — Z1211 Encounter for screening for malignant neoplasm of colon: Secondary | ICD-10-CM | POA: Diagnosis not present

## 2015-08-25 DIAGNOSIS — K5730 Diverticulosis of large intestine without perforation or abscess without bleeding: Secondary | ICD-10-CM | POA: Diagnosis not present

## 2015-08-25 DIAGNOSIS — M13859 Other specified arthritis, unspecified hip: Secondary | ICD-10-CM | POA: Diagnosis not present

## 2015-08-25 DIAGNOSIS — R1013 Epigastric pain: Secondary | ICD-10-CM | POA: Insufficient documentation

## 2015-08-25 DIAGNOSIS — Z79899 Other long term (current) drug therapy: Secondary | ICD-10-CM | POA: Insufficient documentation

## 2015-08-25 DIAGNOSIS — K641 Second degree hemorrhoids: Secondary | ICD-10-CM | POA: Insufficient documentation

## 2015-08-25 DIAGNOSIS — K2970 Gastritis, unspecified, without bleeding: Secondary | ICD-10-CM | POA: Diagnosis not present

## 2015-08-25 DIAGNOSIS — Z7902 Long term (current) use of antithrombotics/antiplatelets: Secondary | ICD-10-CM | POA: Diagnosis not present

## 2015-08-25 DIAGNOSIS — I252 Old myocardial infarction: Secondary | ICD-10-CM | POA: Insufficient documentation

## 2015-08-25 HISTORY — DX: Unspecified osteoarthritis, unspecified site: M19.90

## 2015-08-25 HISTORY — PX: ESOPHAGOGASTRODUODENOSCOPY (EGD) WITH PROPOFOL: SHX5813

## 2015-08-25 HISTORY — PX: COLONOSCOPY WITH PROPOFOL: SHX5780

## 2015-08-25 SURGERY — COLONOSCOPY WITH PROPOFOL
Anesthesia: Monitor Anesthesia Care | Wound class: Contaminated

## 2015-08-25 MED ORDER — STERILE WATER FOR IRRIGATION IR SOLN
Status: DC | PRN
  Administered 2015-08-25: 10:00:00

## 2015-08-25 MED ORDER — PROPOFOL 10 MG/ML IV BOLUS
INTRAVENOUS | Status: DC | PRN
  Administered 2015-08-25: 50 mg via INTRAVENOUS

## 2015-08-25 MED ORDER — GLYCOPYRROLATE 0.2 MG/ML IJ SOLN
INTRAMUSCULAR | Status: DC | PRN
  Administered 2015-08-25: 0.2 mg via INTRAVENOUS

## 2015-08-25 MED ORDER — LIDOCAINE HCL (CARDIAC) 20 MG/ML IV SOLN
INTRAVENOUS | Status: DC | PRN
  Administered 2015-08-25: 20 mg via INTRAVENOUS
  Administered 2015-08-25: 50 mg via INTRAVENOUS
  Administered 2015-08-25 (×2): 20 mg via INTRAVENOUS
  Administered 2015-08-25: 10 mg via INTRAVENOUS
  Administered 2015-08-25 (×4): 20 mg via INTRAVENOUS

## 2015-08-25 MED ORDER — ACETAMINOPHEN 160 MG/5ML PO SOLN: 325.0000 mg | ORAL | Status: DC | PRN

## 2015-08-25 MED ORDER — LACTATED RINGERS IV SOLN
INTRAVENOUS | Status: DC
  Administered 2015-08-25: 09:00:00 via INTRAVENOUS

## 2015-08-25 MED ORDER — SODIUM CHLORIDE 0.9 % IV SOLN: INTRAVENOUS | Status: DC

## 2015-08-25 MED ORDER — ACETAMINOPHEN 325 MG PO TABS: 325.0000 mg | ORAL_TABLET | ORAL | Status: DC | PRN

## 2015-08-25 SURGICAL SUPPLY — 39 items

## 2015-08-25 NOTE — H&P (Signed)
[facility]  [address] [place], [place] [address] Phone: [phone] Fax : [phone]  Primary Care Physician:  Juluis Pitch, MD Primary Gastroenterologist:  Dr. Allen Norris  Pre-Procedure History & Physical: HPI:  Michael Brennan is a 82 y.o. male is here for an endoscopy and colonoscopy.   Past Medical History  Diagnosis Date  . NSTEMI (non-ST elevated myocardial infarction) ([place])   . Arthritis     hips    Past Surgical History  Procedure Laterality Date  . Cardiac catheterization N/A 12/30/2014    Procedure: Left Heart Cath;  Surgeon: Isaias Cowman, MD;  Location: [place] [facility];  Service: Cardiovascular;  Laterality: N/A;  . Appendectomy  1950's    Prior to Admission medications   Medication Sig Start Date End Date Taking? Authorizing Provider  aspirin 81 MG chewable tablet Chew by mouth. 12/31/14  Yes Historical Provider, MD  clopidogrel (PLAVIX) 75 MG tablet Take 1 tablet (75 mg total) by mouth daily with breakfast. 12/31/14  Yes Bettey Costa, MD  nitroGLYCERIN (NITROSTAT) 0.4 MG SL tablet Place 1 tablet (0.4 mg total) under the tongue every 5 (five) minutes as needed for chest pain (if having Chest pain after 3 doses call MD/911). Patient not taking: Reported on 08/18/2015 12/31/14   Bettey Costa, MD    Allergies as of 08/18/2015  . (No Known Allergies)    Family History  Problem Relation Age of Onset  . Alcohol abuse Neg Hx   . Arthritis Neg Hx   . Asthma Neg Hx   . Birth defects Neg Hx   . Cancer Neg Hx   . COPD Neg Hx   . Depression Neg Hx   . Drug abuse Neg Hx   . Diabetes Neg Hx   . Early death Neg Hx   . Hearing loss Neg Hx   . Heart disease Neg Hx   . Hyperlipidemia Neg Hx   . Hypertension Neg Hx   . Kidney disease Neg Hx   . Learning disabilities Neg Hx   . Mental illness Neg Hx   . Mental retardation Neg Hx   . Miscarriages / Stillbirths Neg Hx   . Stroke Neg Hx   . Vision loss Neg Hx   . Varicose Veins Neg Hx      Social History   Social History  . Marital Status: Married    Spouse Name: N/A  . Number of Children: N/A  . Years of Education: N/A   Occupational History  . Not on file.   Social History Main Topics  . Smoking status: Never Smoker   . Smokeless tobacco: Never Used  . Alcohol Use: 3.6 oz/week    6 Cans of beer per week     Comment: has not been drinking alcohol last few weeks  . Drug Use: No  . Sexual Activity: Not on file   Other Topics Concern  . Not on file   Social History Narrative    Review of Systems: See HPI, otherwise negative ROS  Physical Exam: BP 142/65 mmHg  Pulse 85  Temp(Src) 98.1 F (36.7 C) (Temporal)  Resp 16  Ht 5\' 7"  (1.702 m)  Wt 166 lb (75.297 kg)  BMI 25.99 kg/m2  SpO2 98% General:   Alert,  pleasant and cooperative in NAD Head:  Normocephalic and atraumatic. Neck:  Supple; no masses or thyromegaly. Lungs:  Clear throughout to auscultation.    Heart:  Regular rate and rhythm. Abdomen:  Soft, nontender and  nondistended. Normal bowel sounds, without guarding, and without rebound.   Neurologic:  Alert and  oriented x4;  grossly normal neurologically.  Impression/Plan: Michael Brennan is here for an endoscopy and colonoscopy to be performed for weight loss and epigastric pan.  Risks, benefits, limitations, and alternatives regarding  endoscopy and colonoscopy have been reviewed with the patient.  Questions have been answered.  All parties agreeable.   Ollen Bowl, MD  08/25/2015, 9:26 AM

## 2015-08-25 NOTE — Anesthesia Preprocedure Evaluation (Signed)
Anesthesia Evaluation  Patient identified by MRN, date of birth, ID band  Reviewed: Allergy & Precautions, H&P , NPO status , Patient's Chart, lab work & pertinent test results  Airway Mallampati: II  TM Distance: >3 FB Neck ROM: full    Dental no notable dental hx.    Pulmonary    Pulmonary exam normal        Cardiovascular + CAD and + Past MI   Rhythm:regular Rate:Normal     Neuro/Psych    GI/Hepatic   Endo/Other    Renal/GU      Musculoskeletal   Abdominal   Peds  Hematology   Anesthesia Other Findings   Reproductive/Obstetrics                             Anesthesia Physical Anesthesia Plan  ASA: II  Anesthesia Plan: MAC   Post-op Pain Management:    Induction:   Airway Management Planned:   Additional Equipment:   Intra-op Plan:   Post-operative Plan:   Informed Consent: I have reviewed the patients History and Physical, chart, labs and discussed the procedure including the risks, benefits and alternatives for the proposed anesthesia with the patient or authorized representative who has indicated his/her understanding and acceptance.     Plan Discussed with: CRNA  Anesthesia Plan Comments:         Anesthesia Quick Evaluation

## 2015-08-25 NOTE — Op Note (Signed)
[facility] Patient Name: Michael Brennan Procedure Date: 08/25/2015 10:03 AM MRN: [id number] Account #: [id number] Date of Birth: 1933-08-23 Admit Type: Outpatient Age: 82 Room: [facility] OR ROOM 01 Gender: Male Note Status: Finalized Procedure:            Colonoscopy Indications:          Screening for colorectal malignant neoplasm Providers:            Lucilla Lame, MD Referring MD:         Youlanda Roys. Lovie Macadamia, MD (Referring MD) Medicines:            Propofol per Anesthesia Complications:        No immediate complications. Procedure:            Pre-Anesthesia Assessment:                       - Prior to the procedure, a History and Physical was                        performed, and patient medications and allergies were                        reviewed. The patient's tolerance of previous                        anesthesia was also reviewed. The risks and benefits of                        the procedure and the sedation options and risks were                        discussed with the patient. All questions were                        answered, and informed consent was obtained. Prior                        Anticoagulants: The patient has taken no previous                        anticoagulant or antiplatelet agents. ASA Grade                        Assessment: II - A patient with mild systemic disease.                        After reviewing the risks and benefits, the patient was                        deemed in satisfactory condition to undergo the                        procedure.                       After obtaining informed consent, the colonoscope was                        passed under direct vision. Throughout the procedure,  the patient's blood pressure, pulse, and oxygen                        saturations were monitored continuously. The Olympus CF                        H180AL colonoscope (S#: [id number]) was introduced through                         the anus and advanced to the the cecum, identified by                        appendiceal orifice and ileocecal valve. The                        colonoscopy was performed without difficulty. The                        patient tolerated the procedure well. The quality of                        the bowel preparation was excellent. Findings:      The perianal and digital rectal examinations were normal.      Multiple small-mouthed diverticula were found in the sigmoid colon.      Non-bleeding internal hemorrhoids were found during retroflexion. The       hemorrhoids were Grade II (internal hemorrhoids that prolapse but reduce       spontaneously). Impression:           - Diverticulosis in the sigmoid colon.                       - Non-bleeding internal hemorrhoids.                       - No specimens collected. Recommendation:       - Repeat colonoscopy in 10 years for screening unless                        any change in family history or lower GI problems. Procedure Code(s):    --- Professional ---                       [phone], Colonoscopy, flexible; diagnostic, including                        collection of specimen(s) by brushing or washing, when                        performed (separate procedure) Diagnosis Code(s):    --- Professional ---                       Z12.11, Encounter for screening for malignant neoplasm                        of colon CPT copyright 2016 [employer]. All rights reserved. The codes documented in this report are preliminary and upon coder review may  be revised to meet current compliance requirements. Lucilla Lame, MD 08/25/2015 10:28:22 AM This report has been signed electronically. Number of Addenda: 0 Note  Initiated On: 08/25/2015 10:03 AM Scope Withdrawal Time: 0 hours 6 minutes 53 seconds  Total Procedure Duration: 0 hours 8 minutes 38 seconds       [facility]

## 2015-08-25 NOTE — Transfer of Care (Signed)
Immediate Anesthesia Transfer of Care Note  Patient: Michael Brennan  Procedure(s) Performed: Procedure(s): COLONOSCOPY WITH PROPOFOL (N/A) ESOPHAGOGASTRODUODENOSCOPY (EGD) WITH PROPOFOL (N/A)  Patient Location: PACU  Anesthesia Type: MAC  Level of Consciousness: awake, alert  and patient cooperative  Airway and Oxygen Therapy: Patient Spontanous Breathing and Patient connected to supplemental oxygen  Post-op Assessment: Post-op Vital signs reviewed, Patient's Cardiovascular Status Stable, Respiratory Function Stable, Patent Airway and No signs of Nausea or vomiting  Post-op Vital Signs: Reviewed and stable  Complications: No apparent anesthesia complications

## 2015-08-25 NOTE — Op Note (Signed)
[facility] Patient Name: Michael Brennan Procedure Date: 08/25/2015 10:03 AM MRN: [id number] Account #: [id number] Date of Birth: Aug 01, 1933 Admit Type: Outpatient Age: 82 Room: [facility] OR ROOM 01 Gender: Male Note Status: Finalized Procedure:            Upper GI endoscopy Indications:          Epigastric abdominal pain Providers:            Lucilla Lame, MD Referring MD:         Youlanda Roys. Lovie Macadamia, MD (Referring MD) Medicines:            Propofol per Anesthesia Complications:        No immediate complications. Procedure:            Pre-Anesthesia Assessment:                       - Prior to the procedure, a History and Physical was                        performed, and patient medications and allergies were                        reviewed. The patient's tolerance of previous                        anesthesia was also reviewed. The risks and benefits of                        the procedure and the sedation options and risks were                        discussed with the patient. All questions were                        answered, and informed consent was obtained. Prior                        Anticoagulants: The patient has taken Plavix                        (clopidogrel), last dose was 1 week prior to procedure.                        ASA Grade Assessment: II - A patient with mild systemic                        disease. After reviewing the risks and benefits, the                        patient was deemed in satisfactory condition to undergo                        the procedure.                       After obtaining informed consent, the endoscope was                        passed under direct vision. Throughout the procedure,  the patient's blood pressure, pulse, and oxygen                        saturations were monitored continuously. The Olympus                        GIF-HQ190 Endoscope (S#. [id number]) was introduced                 through the mouth, and advanced to the second part of                        duodenum. The upper GI endoscopy was accomplished                        without difficulty. The patient tolerated the procedure                        well. Findings:      LA Grade A (one or more mucosal breaks less than 5 mm, not extending       between tops of 2 mucosal folds) esophagitis with no bleeding was found       at the gastroesophageal junction.      Localized mild inflammation was found in the gastric antrum.      The examined duodenum was normal. Impression:           - LA Grade A esophagitis.                       - Gastritis.                       - Normal examined duodenum.                       - No specimens collected. Recommendation:       - Perform a colonoscopy today. Procedure Code(s):    --- Professional ---                       [phone], Esophagogastroduodenoscopy, flexible, transoral;                        diagnostic, including collection of specimen(s) by                        brushing or washing, when performed (separate procedure) Diagnosis Code(s):    --- Professional ---                       R10.13, Epigastric pain                       K20.9, Esophagitis, unspecified                       K29.70, Gastritis, unspecified, without bleeding CPT copyright 2016 American [employer] Association. All rights reserved. The codes documented in this report are preliminary and upon coder review may  be revised to meet current compliance requirements. Lucilla Lame, MD 08/25/2015 10:17:18 AM This report has been signed electronically. Number of Addenda: 0 Note Initiated On: 08/25/2015 10:03 AM Total Procedure Duration: 0 hours 2 minutes 51 seconds       [facility]

## 2015-08-25 NOTE — Anesthesia Procedure Notes (Signed)
Procedure Name: MAC Performed by: Shin Lamour Pre-anesthesia Checklist: Patient identified, Emergency Drugs available, Suction available, Timeout performed and Patient being monitored Patient Re-evaluated:Patient Re-evaluated prior to inductionOxygen Delivery Method: Nasal cannula Placement Confirmation: positive ETCO2       

## 2015-08-25 NOTE — Anesthesia Postprocedure Evaluation (Signed)
Anesthesia Post Note  Patient: Michael Brennan  Procedure(s) Performed: Procedure(s) (LRB): COLONOSCOPY WITH PROPOFOL (N/A) ESOPHAGOGASTRODUODENOSCOPY (EGD) WITH PROPOFOL (N/A)  Patient location during evaluation: PACU Anesthesia Type: MAC Level of consciousness: awake and alert and oriented Pain management: satisfactory to patient Vital Signs Assessment: post-procedure vital signs reviewed and stable Respiratory status: spontaneous breathing, nonlabored ventilation and respiratory function stable Cardiovascular status: blood pressure returned to baseline and stable Postop Assessment: Adequate PO intake and No signs of nausea or vomiting Anesthetic complications: no    Raliegh Ip

## 2015-08-26 ENCOUNTER — Encounter: Payer: Self-pay | Admitting: Gastroenterology

## 2015-09-01 ENCOUNTER — Ambulatory Visit: Payer: Self-pay | Admitting: Gastroenterology

## 2016-08-27 ENCOUNTER — Ambulatory Visit
Admission: RE | Admit: 2016-08-27 | Discharge: 2016-08-27 | Disposition: A | Discharge status: Home / Self Care | Payer: Medicare Other | Source: Ambulatory Visit | Attending: Family Medicine | Admitting: Family Medicine

## 2016-08-27 ENCOUNTER — Other Ambulatory Visit: Payer: Self-pay | Admitting: Family Medicine

## 2016-08-27 DIAGNOSIS — K769 Liver disease, unspecified: Secondary | ICD-10-CM | POA: Insufficient documentation

## 2016-08-27 DIAGNOSIS — R918 Other nonspecific abnormal finding of lung field: Secondary | ICD-10-CM | POA: Insufficient documentation

## 2016-08-27 DIAGNOSIS — I700 Atherosclerosis of aorta: Secondary | ICD-10-CM | POA: Insufficient documentation

## 2016-08-27 DIAGNOSIS — K5730 Diverticulosis of large intestine without perforation or abscess without bleeding: Secondary | ICD-10-CM | POA: Insufficient documentation

## 2016-08-27 DIAGNOSIS — K8020 Calculus of gallbladder without cholecystitis without obstruction: Secondary | ICD-10-CM | POA: Insufficient documentation

## 2016-08-27 DIAGNOSIS — R109 Unspecified abdominal pain: Secondary | ICD-10-CM | POA: Insufficient documentation

## 2016-08-27 DIAGNOSIS — R590 Localized enlarged lymph nodes: Secondary | ICD-10-CM | POA: Insufficient documentation

## 2016-08-27 DIAGNOSIS — N2889 Other specified disorders of kidney and ureter: Secondary | ICD-10-CM | POA: Insufficient documentation

## 2016-08-27 DIAGNOSIS — E279 Disorder of adrenal gland, unspecified: Secondary | ICD-10-CM | POA: Insufficient documentation

## 2016-08-27 DIAGNOSIS — K4020 Bilateral inguinal hernia, without obstruction or gangrene, not specified as recurrent: Secondary | ICD-10-CM | POA: Insufficient documentation

## 2016-08-27 MED ORDER — IOPAMIDOL (ISOVUE-300) INJECTION 61%
100.0000 mL | Freq: Once | INTRAVENOUS | Status: AC | PRN
  Administered 2016-08-27: 100 mL via INTRAVENOUS

## 2016-08-29 ENCOUNTER — Other Ambulatory Visit: Payer: Self-pay | Admitting: Family Medicine

## 2016-08-29 DIAGNOSIS — E278 Other specified disorders of adrenal gland: Secondary | ICD-10-CM

## 2016-08-30 ENCOUNTER — Ambulatory Visit
Admission: RE | Admit: 2016-08-30 | Discharge: 2016-08-30 | Disposition: A | Discharge status: Home / Self Care | Payer: Medicare Other | Source: Ambulatory Visit | Attending: Family Medicine | Admitting: Family Medicine

## 2016-08-30 DIAGNOSIS — E278 Other specified disorders of adrenal gland: Secondary | ICD-10-CM

## 2016-08-30 DIAGNOSIS — K869 Disease of pancreas, unspecified: Secondary | ICD-10-CM | POA: Insufficient documentation

## 2016-08-30 DIAGNOSIS — E279 Disorder of adrenal gland, unspecified: Secondary | ICD-10-CM | POA: Insufficient documentation

## 2016-08-30 DIAGNOSIS — N2889 Other specified disorders of kidney and ureter: Secondary | ICD-10-CM | POA: Insufficient documentation

## 2016-08-30 DIAGNOSIS — C772 Secondary and unspecified malignant neoplasm of intra-abdominal lymph nodes: Secondary | ICD-10-CM | POA: Insufficient documentation

## 2016-08-30 DIAGNOSIS — C787 Secondary malignant neoplasm of liver and intrahepatic bile duct: Secondary | ICD-10-CM | POA: Insufficient documentation

## 2016-08-30 DIAGNOSIS — C7972 Secondary malignant neoplasm of left adrenal gland: Secondary | ICD-10-CM | POA: Insufficient documentation

## 2016-08-30 MED ORDER — GADOBENATE DIMEGLUMINE 529 MG/ML IV SOLN
16.0000 mL | Freq: Once | INTRAVENOUS | Status: AC | PRN
  Administered 2016-08-30: 15 mL via INTRAVENOUS

## 2016-08-31 ENCOUNTER — Telehealth: Payer: Self-pay

## 2016-08-31 NOTE — Telephone Encounter (Signed)
Patient was informed of ref' and NP appt scheduled for 09/06/16 @ 2:45pm.  NP packet mailed out.

## 2016-09-04 NOTE — Progress Notes (Signed)
Hematology/Oncology Consult note [facility] Telephone:([phone] [phone]  Patient Care Team: Juluis Pitch, MD as PCP - General (Family Medicine) Yolonda Kida, MD as Consulting Physician (Cardiology)   Name of the patient: Michael Brennan  [id number]  1934/04/25    Reason for referral- leucocytosis   Referring physician- Dr. Lovie Macadamia  Date of visit: 09/04/16   History of presenting illness- 1. Patient is a 83 year old male with a past medical history significant for hypertension and hyperlipidemia. Patient was doing well up until December when he started developing worsening left-sided abdominal pain. He was treated presumptively with antibiotics for possible colitis but the pain did not get better. Patient has also been having low-grade fevers along with drenching night sweats and fatigue  2. This was followed by a CT abdomen and pelvis on 08/27/2016 which showed 5.8 cm ill-defined heterogeneous mass in the upper and mid pole of the left kidney. This is suspicious for malignancy. 3.1 cm indeterminate left adrenal mass and small indeterminate low-attenuation liver lesions area and recommend MRI with and without contrast for further evaluation. Mild left abdominal retroperitoneal and celiac axis adenopathy  3. MRI of the abdomen on 08/30/2016 showed: 5.4 x 5.9 x 5.3 cm left renal mass involving the middle/upper pole with extension into the proximal aspect of the left renal vein (not extending to the IVC), left adrenal metastasis, multiple liver metastases and left periaortic metastatic lymphadenopathy. 2. 6 mm tiny cystic appearing lesion in the uncinate process of the pancreas. Repeat MRI of the abdomen with and without IV gadolinium with MRCP is suggested in 2 years to ensure stability of this Lesion.  4.  Also recent CBC from 08/29/2016 showed white count of 41.2 with an H&H of 11/33.1, MCV 84.7 and a platelet count of 560. Patient has had  a normal white count in June and February 2017 but his white count on 2 occasions in February 2018 was 23.3 followed by 28.6. Patient has had an elevated platelet count between 490-554 in 2017. Differential showed 89.8% neutrophils with relative lymphopenia  5. Prior to the acute events patient has been very active and does not have significant comorbidities  ECOG PS- 1  Pain scale- 5   Review of systems- Review of Systems  Constitutional: Positive for malaise/fatigue. Negative for chills, fever and weight loss.       Night sweats +  HENT: Negative for congestion, ear discharge and nosebleeds.   Eyes: Negative for blurred vision.  Respiratory: Negative for cough, hemoptysis, sputum production, shortness of breath and wheezing.   Cardiovascular: Negative for chest pain, palpitations, orthopnea and claudication.  Gastrointestinal: Negative for abdominal pain, blood in stool, constipation, diarrhea, heartburn, melena, nausea and vomiting.  Genitourinary: Negative for dysuria, flank pain, frequency, hematuria and urgency.  Musculoskeletal: Negative for back pain, joint pain and myalgias.       Left flank pain +  Skin: Negative for rash.  Neurological: Positive for weakness. Negative for dizziness, tingling, focal weakness, seizures and headaches.  Endo/Heme/Allergies: Does not bruise/bleed easily.  Psychiatric/Behavioral: Negative for depression and suicidal ideas. The patient does not have insomnia.     No Known Allergies  Patient Active Problem List   Diagnosis Date Noted  . Special screening for malignant neoplasms, colon   . Abdominal pain, epigastric   . Esophagitis, unspecified   . Gastritis   . Blood in the urine 02/22/2015  . S/P drug eluting coronary stent placement 12/31/2014  . Presence of coronary angioplasty implant and  graft 12/31/2014  . Acute non Q wave MI (myocardial infarction), initial episode of care ([place]) 12/29/2014  . Myocardial infarction 12/29/2014      Past Medical History:  Diagnosis Date  . Arthritis    hips  . NSTEMI (non-ST elevated myocardial infarction) [facility])      Past Surgical History:  Procedure Laterality Date  . APPENDECTOMY  1950's  . CARDIAC CATHETERIZATION N/A 12/30/2014   Procedure: Left Heart Cath;  Surgeon: Isaias Cowman, MD;  Location: [place] [facility];  Service: Cardiovascular;  Laterality: N/A;  . COLONOSCOPY WITH PROPOFOL N/A 08/25/2015   Procedure: COLONOSCOPY WITH PROPOFOL;  Surgeon: Lucilla Lame, MD;  Location: [place];  Service: Endoscopy;  Laterality: N/A;  . ESOPHAGOGASTRODUODENOSCOPY (EGD) WITH PROPOFOL N/A 08/25/2015   Procedure: ESOPHAGOGASTRODUODENOSCOPY (EGD) WITH PROPOFOL;  Surgeon: Lucilla Lame, MD;  Location: [place];  Service: Endoscopy;  Laterality: N/A;    Social History   Social History  . Marital status: Married    Spouse name: N/A  . Number of children: N/A  . Years of education: N/A   Occupational History  . Not on file.   Social History Main Topics  . Smoking status: Never Smoker  . Smokeless tobacco: Never Used  . Alcohol use 3.6 oz/week    6 Cans of beer per week     Comment: has not been drinking alcohol last few weeks  . Drug use: No  . Sexual activity: Not on file   Other Topics Concern  . Not on file   Social History Narrative  . No narrative on file     Family History  Problem Relation Age of Onset  . Alcohol abuse Neg Hx   . Arthritis Neg Hx   . Asthma Neg Hx   . Birth defects Neg Hx   . Cancer Neg Hx   . COPD Neg Hx   . Depression Neg Hx   . Drug abuse Neg Hx   . Diabetes Neg Hx   . Early death Neg Hx   . Hearing loss Neg Hx   . Heart disease Neg Hx   . Hyperlipidemia Neg Hx   . Hypertension Neg Hx   . Kidney disease Neg Hx   . Learning disabilities Neg Hx   . Mental illness Neg Hx   . Mental retardation Neg Hx   . Miscarriages / Stillbirths Neg Hx   . Stroke Neg Hx   . Vision loss Neg Hx   . Varicose Veins  Neg Hx      Current Outpatient Prescriptions:  .  aspirin 81 MG chewable tablet, Chew by mouth., Disp: , Rfl:  .  clopidogrel (PLAVIX) 75 MG tablet, Take 1 tablet (75 mg total) by mouth daily with breakfast., Disp: 30 tablet, Rfl: 0 .  nitroGLYCERIN (NITROSTAT) 0.4 MG SL tablet, Place 1 tablet (0.4 mg total) under the tongue every 5 (five) minutes as needed for chest pain (if having Chest pain after 3 doses call MD/911). (Patient not taking: Reported on 08/18/2015), Disp: 30 tablet, Rfl: 0   Physical exam:  Vitals:   09/06/16 1446  BP: 127/72  Pulse: (!) 102  Resp: (!) 22  Temp: 98.4 F (36.9 C)  TempSrc: Tympanic  Weight: 164 lb 12.7 oz (74.7 kg)  Height: 5' 7.52" (1.715 m)   Physical Exam  Constitutional: He is oriented to person, place, and time.  Patient is well-developed and well-nourished and appears to be in mild distress from flank pain  HENT:  Head: Normocephalic and atraumatic.  Eyes: EOM are normal. Pupils are equal, round, and reactive to light.  Neck: Normal range of motion.  Cardiovascular: Normal rate, regular rhythm and normal heart sounds.   Pulmonary/Chest: Effort normal and breath sounds normal.  Abdominal: Soft. Bowel sounds are normal.  Lymphadenopathy:  No palpable cervical axillary or inguinal adenopathy  Neurological: He is alert and oriented to person, place, and time.  Skin: Skin is warm and dry.       CMP Latest Ref Rng & Units 08/09/2015  Glucose 65 - 99 mg/dL 100(H)  BUN 6 - 20 mg/dL 16  Creatinine 0.61 - 1.24 mg/dL 1.23  Sodium 135 - 145 mmol/L 139  Potassium 3.5 - 5.1 mmol/L 4.0  Chloride 101 - 111 mmol/L 104  CO2 22 - 32 mmol/L 26  Calcium 8.9 - 10.3 mg/dL 8.7(L)  Total Protein 6.5 - 8.1 g/dL 7.1  Total Bilirubin 0.3 - 1.2 mg/dL 0.8  Alkaline Phos 38 - 126 U/L 160(H)  AST 15 - 41 U/L 40  ALT 17 - 63 U/L 48   CBC Latest Ref Rng & Units 08/09/2015  WBC 3.8 - 10.6 K/uL 12.0(H)  Hemoglobin 13.0 - 18.0 g/dL 12.5(L)  Hematocrit 40.0 -  52.0 % 37.0(L)  Platelets 150 - 440 K/uL 488(H)    No images are attached to the encounter.  Mr Abdomen Wwo Contrast  Result Date: 08/30/2016 CLINICAL DATA:  83 year old male with history of left-sided abdominal pain and low-grade fever for 1 month. Left-sided flank pain. Left renal mass noted on recent CT examination. EXAM: MRI ABDOMEN WITHOUT AND WITH CONTRAST TECHNIQUE: Multiplanar multisequence MR imaging of the abdomen was performed both before and after the administration of intravenous contrast. CONTRAST:  2m MULTIHANCE GADOBENATE DIMEGLUMINE 529 MG/ML IV SOLN COMPARISON:  No prior abdominal MRI. CT the abdomen and pelvis 08/27/2016. FINDINGS: Lower chest: Unremarkable. Hepatobiliary: The liver is incompletely visualized on several pulse sequences. With these limitations in mind, there are multiple T2 hyperintense lesions in the liver, most of which are T1 hypointense and do not enhance centrally; however, these lesions have an unusual halo of T2 hyperintensity surrounding them, and on the post gadolinium images they. A have a thick rind of subtle enhancement. The exception is a 2.7 x 2.5 cm lesion in segment 6 (image 9 of series 4) which is mildly T1 hyperintense (presumably related to some central hemorrhage or necrosis), T2 hypointense, and otherwise similar in appearance, which clearly demonstrates restricted diffusion. These lesions are suspicious for metastatic lesions. A few other smaller T1 hypointense, T2 hyperintense, nonenhancing lesions are compatible with tiny cysts and/or biliary hamartomas. No intra or extrahepatic biliary ductal dilatation. Gallbladder is nearly completely contracted around 2 large gallstones, measuring up to 16 mm in the fundus. No findings to suggest an acute cholecystitis at this time. Pancreas: Well-circumscribed 6 mm T1 hypointense, T2 hyperintense, nonenhancing lesion in the uncinate process of the pancreas, likely to represent a tiny pancreatic pseudocyst, but  incompletely characterized. No larger more suspicious appearing pancreatic mass. No pancreatic ductal dilatation. No pancreatic or peripancreatic fluid or inflammatory changes. Spleen:  Unremarkable. Adrenals/Urinary Tract: Throughout the mid and upper pole of the left kidney there is a large heterogeneously enhancing mass which measures 5.4 x 5.9 x 5.3 cm (axial image 36 of series 13 and coronal image 32 of series 14) which appears encapsulated within Gerota's fascia, but does appear to extend into the proximal aspect of the left renal vein, best appreciated on axial image  36 of series 13. Right kidney and right adrenal gland are normal in appearance. Heterogeneously enhancing mass in the left adrenal gland, concerning for metastatic lesion, measuring 3.8 x 2.1 cm (axial image 22 of series 13). No hydroureteronephrosis in the visualized portions of the abdomen. Stomach/Bowel: Visualized portions are unremarkable. Vascular/Lymphatic: No aneurysm identified in the visualized abdominal vasculature. Probable tumor thrombus extending into the proximal aspect of the left renal vein (this does not cross the midline, and does not extend to the IVC). Several enlarged left-sided para-aortic lymph nodes are noted measuring up to 13 mm in short axis adjacent to the left renal hilum (image 40 of series 11). One of these enlarged left para-aortic lymph nodes appears centrally necrotic (12 mm lymph node on image 21 of series 11). Other:  No significant volume of ascites.  No pneumoperitoneum. Musculoskeletal: No aggressive appearing lesions are noted in the visualized portions of the skeleton. IMPRESSION: 1. 5.4 x 5.9 x 5.3 cm left renal mass involving the middle/upper pole with extension into the proximal aspect of the left renal vein (not extending to the IVC), left adrenal metastasis, multiple liver metastases and left periaortic metastatic lymphadenopathy. 2. 6 mm tiny cystic appearing lesion in the uncinate process of the  pancreas. Repeat MRI of the abdomen with and without IV gadolinium with MRCP is suggested in 2 years to ensure stability of this lesion. This recommendation follows ACR consensus guidelines: Management of Incidental Pancreatic Cysts: A White Paper of the ACR Incidental Findings Committee. [place] 1779;[id number]. 3. Additional incidental findings, as above. Electronically Signed   By: Vinnie Langton M.D.   On: 08/30/2016 09:36   Ct Abdomen Pelvis W Contrast  Result Date: 08/27/2016 CLINICAL DATA:  Left lower quadrant pain and low-grade fever for 1 month. EXAM: CT ABDOMEN AND PELVIS WITH CONTRAST TECHNIQUE: Multidetector CT imaging of the abdomen and pelvis was performed using the standard protocol following bolus administration of intravenous contrast. CONTRAST:  155m ISOVUE-300 IOPAMIDOL (ISOVUE-300) INJECTION 61% COMPARISON:  100 mL Isovue 300 FINDINGS: Lower Chest: Several bibasilar pulmonary nodules are seen bilaterally, largest in the right lower lobe measuring 5 mm. Hepatobiliary: Several small low-attenuation lesions are seen within the liver, several which are too small to characterize. Several contain irregular margins with possible mild rim enhancement, and have higher attenuation than simple cysts. Several calcified gallstones are seen, without evidence of cholecystitis or biliary ductal dilatation. Pancreas:  No mass or inflammatory changes. Spleen: Within normal limits in size and appearance. Adrenals/Urinary Tract: A heterogeneous mass is seen involving the left adrenal gland which measures 2.6 x 3.1 cm. Ill-defined heterogeneous low-attenuation mass in the upper and mid pole of left kidney measuring 5.2 x 5.8 cm. This shows extension into the left renal sinus and mild dilatation lower pole intrarenal collecting system. This is suspicious for malignancy, with pyelonephritis considered less likely. Stomach/Bowel: No evidence of obstruction, inflammatory process or abnormal fluid  collections. Sigmoid diverticulosis noted, without evidence of diverticulitis. Vascular/Lymphatic: Mild left retroperitoneal lymphadenopathy is seen with largest lymph node in left paraaortic region measuring 12 mm on image 32/2. A partially necrotic celiac axis lymph node is also seen measuring 15 mm on image 21/2. No pelvic lymphadenopathy identified. No abdominal aortic aneurysm.  Aortic atherosclerosis. Reproductive:  No mass identified. Other: Small left inguinal hernia is seen containing only fat. Small right inguinal hernia contains a small portion of the urinary bladder. Musculoskeletal:  No suspicious bone lesions identified. IMPRESSION: 5.8 cm ill-defined heterogeneous mass in the  upper and midpole of left kidney. This is suspicious for malignancy, with pyelonephritis considered less likely. Mild left abdominal retroperitoneal and celiac axis lymphadenopathy, suspicious for metastatic disease. 3.1 cm indeterminate left adrenal mass and small indeterminate low-attenuation liver lesions. Recommend abdomen MRI without and with contrast for further characterization. Indeterminate sub-cm bibasilar pulmonary nodules. Consider chest CT for further evaluation . Additional findings including aortic atherosclerosis, cholelithiasis, colonic diverticulosis, and bilateral inguinal hernias. Electronically Signed   By: Earle Gell M.D.   On: 08/27/2016 14:10    Assessment and plan- Patient is a 83 y.o. male who has been referred to [place] for leukocytosis/thrombocytosis  I discussed the results of the CT abdomen as well as MRI of the patient in detail. He does have a large left renal mass as well as an adrenal mass and multiple liver metastases. I reviewed his images with IR attending Dr. Kathlene Cote today. He brings ultrasound-guided liver biopsy. Also he doesn't think that the appearance of the adrenal mass is classic for renal cell carcinoma given the diffuse infiltrating pattern. It is possible that we may be dealing  with some other malignancy. At this time we will await ultrasound-guided liver biopsy results to make definitive tissue diagnosis. Based on his GFR today I will decide if we can do CT with contrast without contrast as he has had abnormal creatinine in the recent past. She will also need a bone scan for complete staging  With regards to his leukocytosis- this is all predominantly neutrophilia. Thrombocytosis can also be seen and reactive to underlying malignancy. Today I will repeat a CBC with diff and also get peripheral flow cytometry and LDH based on today's CBC as well as liver biopsy results I will consider doing a bone marrow biopsy in the future. I will see the patient back in 2 weeks' time after his biopsies completed  Thank you for this kind referral and the opportunity to participate in the care of this patient   Visit Diagnosis 1. Renal mass   2. Liver metastases ([place])   3. Adrenal mass, left (HCC)   4. Bandemia     Dr. Randa Evens, MD, MPH [place] [facility] Pager- [phone] 09/04/2016 8:13 AM

## 2016-09-06 ENCOUNTER — Encounter: Payer: Self-pay | Admitting: Oncology

## 2016-09-06 ENCOUNTER — Inpatient Hospital Stay: Payer: Self-pay | Attending: Oncology | Admitting: Oncology

## 2016-09-06 ENCOUNTER — Inpatient Hospital Stay: Payer: Medicare Other

## 2016-09-06 VITALS — BP 127/72 | HR 102 | Temp 98.4°F | Resp 22 | Ht 67.52 in | Wt 164.8 lb

## 2016-09-06 DIAGNOSIS — R509 Fever, unspecified: Secondary | ICD-10-CM | POA: Insufficient documentation

## 2016-09-06 DIAGNOSIS — I10 Essential (primary) hypertension: Secondary | ICD-10-CM | POA: Insufficient documentation

## 2016-09-06 DIAGNOSIS — R61 Generalized hyperhidrosis: Secondary | ICD-10-CM | POA: Insufficient documentation

## 2016-09-06 DIAGNOSIS — E785 Hyperlipidemia, unspecified: Secondary | ICD-10-CM | POA: Insufficient documentation

## 2016-09-06 DIAGNOSIS — N2889 Other specified disorders of kidney and ureter: Secondary | ICD-10-CM | POA: Insufficient documentation

## 2016-09-06 DIAGNOSIS — K209 Esophagitis, unspecified: Secondary | ICD-10-CM | POA: Insufficient documentation

## 2016-09-06 DIAGNOSIS — R918 Other nonspecific abnormal finding of lung field: Secondary | ICD-10-CM | POA: Insufficient documentation

## 2016-09-06 DIAGNOSIS — M129 Arthropathy, unspecified: Secondary | ICD-10-CM | POA: Insufficient documentation

## 2016-09-06 DIAGNOSIS — D72825 Bandemia: Secondary | ICD-10-CM | POA: Insufficient documentation

## 2016-09-06 DIAGNOSIS — E279 Disorder of adrenal gland, unspecified: Secondary | ICD-10-CM | POA: Insufficient documentation

## 2016-09-06 DIAGNOSIS — R109 Unspecified abdominal pain: Secondary | ICD-10-CM | POA: Insufficient documentation

## 2016-09-06 DIAGNOSIS — R5383 Other fatigue: Secondary | ICD-10-CM | POA: Insufficient documentation

## 2016-09-06 DIAGNOSIS — K769 Liver disease, unspecified: Secondary | ICD-10-CM | POA: Insufficient documentation

## 2016-09-06 DIAGNOSIS — D72810 Lymphocytopenia: Secondary | ICD-10-CM | POA: Insufficient documentation

## 2016-09-06 DIAGNOSIS — E278 Other specified disorders of adrenal gland: Secondary | ICD-10-CM

## 2016-09-06 DIAGNOSIS — Z7982 Long term (current) use of aspirin: Secondary | ICD-10-CM | POA: Insufficient documentation

## 2016-09-06 DIAGNOSIS — C787 Secondary malignant neoplasm of liver and intrahepatic bile duct: Secondary | ICD-10-CM

## 2016-09-06 DIAGNOSIS — K8020 Calculus of gallbladder without cholecystitis without obstruction: Secondary | ICD-10-CM | POA: Insufficient documentation

## 2016-09-06 DIAGNOSIS — I252 Old myocardial infarction: Secondary | ICD-10-CM | POA: Insufficient documentation

## 2016-09-06 DIAGNOSIS — D72829 Elevated white blood cell count, unspecified: Secondary | ICD-10-CM | POA: Insufficient documentation

## 2016-09-06 DIAGNOSIS — K4090 Unilateral inguinal hernia, without obstruction or gangrene, not specified as recurrent: Secondary | ICD-10-CM | POA: Insufficient documentation

## 2016-09-06 DIAGNOSIS — R599 Enlarged lymph nodes, unspecified: Secondary | ICD-10-CM | POA: Insufficient documentation

## 2016-09-06 DIAGNOSIS — Z79899 Other long term (current) drug therapy: Secondary | ICD-10-CM | POA: Insufficient documentation

## 2016-09-06 DIAGNOSIS — I700 Atherosclerosis of aorta: Secondary | ICD-10-CM | POA: Insufficient documentation

## 2016-09-06 DIAGNOSIS — Z8719 Personal history of other diseases of the digestive system: Secondary | ICD-10-CM | POA: Insufficient documentation

## 2016-09-06 LAB — CBC WITH DIFFERENTIAL/PLATELET
BASOS ABS: 0.2 10*3/uL — AB (ref 0–0.1)
Basophils Relative: 0 %
EOS PCT: 1 %
Eosinophils Absolute: 0.5 10*3/uL (ref 0–0.7)
HEMATOCRIT: 33.7 % — AB (ref 40.0–52.0)
Hemoglobin: 10.8 g/dL — ABNORMAL LOW (ref 13.0–18.0)
LYMPHS PCT: 2 %
Lymphs Abs: 1.3 10*3/uL (ref 1.0–3.6)
MCH: 26 pg (ref 26.0–34.0)
MCHC: 32.1 g/dL (ref 32.0–36.0)
MCV: 80.9 fL (ref 80.0–100.0)
Monocytes Absolute: 2.1 10*3/uL — ABNORMAL HIGH (ref 0.2–1.0)
Monocytes Relative: 4 %
NEUTROS ABS: 53.2 10*3/uL — AB (ref 1.4–6.5)
Neutrophils Relative %: 93 %
PLATELETS: 560 10*3/uL — AB (ref 150–440)
RBC: 4.16 MIL/uL — ABNORMAL LOW (ref 4.40–5.90)
RDW: 15.2 % — AB (ref 11.5–14.5)
WBC: 57.3 10*3/uL (ref 3.8–10.6)

## 2016-09-06 LAB — BASIC METABOLIC PANEL
ANION GAP: 7 (ref 5–15)
BUN: 24 mg/dL — AB (ref 6–20)
CALCIUM: 11.4 mg/dL — AB (ref 8.9–10.3)
CO2: 25 mmol/L (ref 22–32)
CREATININE: 1.69 mg/dL — AB (ref 0.61–1.24)
Chloride: 98 mmol/L — ABNORMAL LOW (ref 101–111)
GFR calc Af Amer: 42 mL/min — ABNORMAL LOW (ref >60)
GFR, EST NON AFRICAN AMERICAN: 37 mL/min — AB (ref >60)
GLUCOSE: 108 mg/dL — AB (ref 65–99)
Potassium: 4.4 mmol/L (ref 3.5–5.1)
Sodium: 130 mmol/L — ABNORMAL LOW (ref 135–145)

## 2016-09-06 LAB — LACTATE DEHYDROGENASE: LDH: 148 U/L (ref 98–192)

## 2016-09-06 MED ORDER — OXYCODONE HCL 5 MG PO TABS: 5.0000 mg | ORAL_TABLET | ORAL | 0 refills | Status: DC | PRN

## 2016-09-06 NOTE — Progress Notes (Signed)
Patient reports constant left side and abdominal pain 5/10.  Patient is not on pain medications at this time, but nothing relieves the pain.  Patient reports low grade fevers daily starting around 6 pm up to 101.0 degrees daily. No other issues at this time.

## 2016-09-07 ENCOUNTER — Telehealth: Payer: Self-pay | Admitting: *Deleted

## 2016-09-07 ENCOUNTER — Encounter: Payer: Self-pay | Admitting: Oncology

## 2016-09-07 NOTE — Telephone Encounter (Signed)
Called pt to let them know that bx 3/7. He already saw it on my chart. He knows to be NPO after midnight the night before bx. He already has schedule to rtn to see Dr. Janese Banks 3/16. He is agreeable to the plan

## 2016-09-07 NOTE — Addendum Note (Signed)
Addended by: Luella Cook on: 09/07/2016 09:02 AM   Modules accepted: Orders

## 2016-09-11 ENCOUNTER — Other Ambulatory Visit: Payer: Self-pay | Admitting: General Surgery

## 2016-09-12 ENCOUNTER — Ambulatory Visit
Admission: RE | Admit: 2016-09-12 | Discharge: 2016-09-12 | Disposition: A | Discharge status: Home / Self Care | Payer: Medicare Other | Source: Ambulatory Visit | Attending: Oncology | Admitting: Oncology

## 2016-09-12 DIAGNOSIS — E279 Disorder of adrenal gland, unspecified: Secondary | ICD-10-CM | POA: Insufficient documentation

## 2016-09-12 DIAGNOSIS — C787 Secondary malignant neoplasm of liver and intrahepatic bile duct: Secondary | ICD-10-CM | POA: Insufficient documentation

## 2016-09-12 DIAGNOSIS — D72825 Bandemia: Secondary | ICD-10-CM | POA: Insufficient documentation

## 2016-09-12 DIAGNOSIS — Z79899 Other long term (current) drug therapy: Secondary | ICD-10-CM | POA: Insufficient documentation

## 2016-09-12 DIAGNOSIS — E278 Other specified disorders of adrenal gland: Secondary | ICD-10-CM

## 2016-09-12 DIAGNOSIS — Z7982 Long term (current) use of aspirin: Secondary | ICD-10-CM | POA: Insufficient documentation

## 2016-09-12 DIAGNOSIS — N2889 Other specified disorders of kidney and ureter: Secondary | ICD-10-CM | POA: Insufficient documentation

## 2016-09-12 HISTORY — DX: Personal history of urinary calculi: Z87.442

## 2016-09-12 LAB — CBC
HEMATOCRIT: 33.6 % — AB (ref 40.0–52.0)
Hemoglobin: 11.2 g/dL — ABNORMAL LOW (ref 13.0–18.0)
MCH: 26.5 pg (ref 26.0–34.0)
MCHC: 33.2 g/dL (ref 32.0–36.0)
MCV: 79.8 fL — AB (ref 80.0–100.0)
PLATELETS: 513 10*3/uL — AB (ref 150–440)
RBC: 4.22 MIL/uL — ABNORMAL LOW (ref 4.40–5.90)
RDW: 15.4 % — AB (ref 11.5–14.5)
WBC: 81 10*3/uL — AB (ref 3.8–10.6)

## 2016-09-12 LAB — PROTIME-INR
INR: 1.19
Prothrombin Time: 15.2 seconds (ref 11.4–15.2)

## 2016-09-12 LAB — APTT: aPTT: 30 seconds (ref 24–36)

## 2016-09-12 MED ORDER — MIDAZOLAM HCL 5 MG/5ML IJ SOLN
INTRAMUSCULAR | Status: AC | PRN
Start: 2016-09-12 — End: 2016-09-12
  Administered 2016-09-12: 1 mg via INTRAVENOUS

## 2016-09-12 MED ORDER — FENTANYL CITRATE (PF) 100 MCG/2ML IJ SOLN
INTRAMUSCULAR | Status: AC | PRN
  Administered 2016-09-12: 50 ug via INTRAVENOUS

## 2016-09-12 MED ORDER — SODIUM CHLORIDE 0.9 % IV SOLN
INTRAVENOUS | Status: DC
  Administered 2016-09-12: 09:00:00 via INTRAVENOUS

## 2016-09-12 NOTE — Procedures (Signed)
Interventional Radiology Procedure Note  Procedure: US guided biopsy of liver lesion  Complications: None  Estimated Blood Loss: < 10 mL  18 G core biopsy x 3 via 17 G needle of inferior right lobe hepatic lesion.    Venetia Night. Kathlene Cote, M.D Pager:  [phone]

## 2016-09-12 NOTE — H&P (Signed)
Chief Complaint: Patient was seen in consultation today for liver biopsy at the request of Rao,Archana C  Referring Physician(s): Rao,Archana C  Patient Status: [facility] - Out-pt   History of Present Illness: Michael Brennan is a 83 y.o. male with a history of abdominal pain and recent diagnosis of a large, infiltrating 6 cm left renal mass invading the left renal vein and multiple liver lesions.  Now very weak and with decreased appetite per wife. She states that his performance status has declined dramatically in last week or so. Also recently developed constant hiccups that have not stopped for several days. Referred for liver biopsy by Dr. Janese Banks.  Past Medical History:  Diagnosis Date  . Arthritis    hips  . Cataract   . History of kidney stones   . NSTEMI (non-ST elevated myocardial infarction) ([place])   . Skin cancer     Past Surgical History:  Procedure Laterality Date  . APPENDECTOMY  1950's  . CARDIAC CATHETERIZATION N/A 12/30/2014   Procedure: Left Heart Cath;  Surgeon: Isaias Cowman, MD;  Location: [facility];  Service: Cardiovascular;  Laterality: N/A;  . COLONOSCOPY WITH PROPOFOL N/A 08/25/2015   Procedure: COLONOSCOPY WITH PROPOFOL;  Surgeon: Lucilla Lame, MD;  Location: [facility];  Service: Endoscopy;  Laterality: N/A;  . ESOPHAGOGASTRODUODENOSCOPY (EGD) WITH PROPOFOL N/A 08/25/2015   Procedure: ESOPHAGOGASTRODUODENOSCOPY (EGD) WITH PROPOFOL;  Surgeon: Lucilla Lame, MD;  Location: [facility];  Service: Endoscopy;  Laterality: N/A;    Allergies: Patient has no known allergies.  Medications: Prior to Admission medications   Medication Sig Start Date End Date Taking? Authorizing Provider  aspirin 81 MG chewable tablet Chew by mouth. 12/31/14  Yes Historical Provider, MD  levofloxacin (LEVAQUIN) 750 MG tablet Take 750 mg by mouth daily.   Yes Historical Provider, MD  oxyCODONE (OXY IR/ROXICODONE) 5 MG immediate release tablet Take 1 tablet  (5 mg total) by mouth every 4 (four) hours as needed for severe pain. 09/06/16  Yes Sindy Guadeloupe, MD  nitroGLYCERIN (NITROSTAT) 0.4 MG SL tablet Place 1 tablet (0.4 mg total) under the tongue every 5 (five) minutes as needed for chest pain (if having Chest pain after 3 doses call MD/911). Patient not taking: Reported on 08/18/2015 12/31/14   Bettey Costa, MD     Family History  Problem Relation Age of Onset  . Alcohol abuse Neg Hx   . Arthritis Neg Hx   . Asthma Neg Hx   . Birth defects Neg Hx   . Cancer Neg Hx   . COPD Neg Hx   . Depression Neg Hx   . Drug abuse Neg Hx   . Diabetes Neg Hx   . Early death Neg Hx   . Hearing loss Neg Hx   . Heart disease Neg Hx   . Hyperlipidemia Neg Hx   . Hypertension Neg Hx   . Kidney disease Neg Hx   . Learning disabilities Neg Hx   . Mental illness Neg Hx   . Mental retardation Neg Hx   . Miscarriages / Stillbirths Neg Hx   . Stroke Neg Hx   . Vision loss Neg Hx   . Varicose Veins Neg Hx     Social History   Social History  . Marital status: Married    Spouse name: N/A  . Number of children: N/A  . Years of education: N/A   Social History Main Topics  . Smoking status: Never Smoker  .  Smokeless tobacco: Never Used  . Alcohol use 3.6 oz/week    6 Cans of beer per week     Comment: has not been drinking alcohol last few weeks  . Drug use: No  . Sexual activity: Not Asked   Other Topics Concern  . None   Social History Narrative  . None    ECOG Status: 2 - Symptomatic, <50% confined to bed  Review of Systems: A 12 point ROS discussed and pertinent positives are indicated in the HPI above.  All other systems are negative.  Review of Systems  Constitutional: Positive for activity change, appetite change and fatigue.  HENT: Negative.   Respiratory: Negative.   Cardiovascular: Negative.   Gastrointestinal: Positive for abdominal pain and constipation. Negative for blood in stool, diarrhea, nausea and vomiting.  Genitourinary:  Negative.   Musculoskeletal: Negative.   Neurological: Negative.     Vital Signs: BP 127/69   Pulse 82   Temp 97.8 F (36.6 C) (Oral)   Resp 19   SpO2 100%   Physical Exam  Constitutional: He is oriented to person, place, and time.  Appears weak, frail.  HENT:  Head: Normocephalic and atraumatic.  Neck: Neck supple. No JVD present.  Cardiovascular: Normal rate, regular rhythm and normal heart sounds.  Exam reveals no gallop and no friction rub.   No murmur heard. Pulmonary/Chest: Effort normal and breath sounds normal. No stridor. No respiratory distress. He has no wheezes. He has no rales.  Abdominal: Soft. He exhibits no distension and no mass. There is no tenderness. There is no rebound and no guarding.  Musculoskeletal: He exhibits no edema.  Neurological: He is alert and oriented to person, place, and time.  Skin: He is not diaphoretic.  Vitals reviewed.   Mallampati Score:  MD Evaluation Airway: WNL Heart: WNL Abdomen: WNL Chest/ Lungs: WNL ASA  Classification: 3 Mallampati/Airway Score: One  Imaging: Mr Abdomen Wwo Contrast  Result Date: 08/30/2016 CLINICAL DATA:  83 year old male with history of left-sided abdominal pain and low-grade fever for 1 month. Left-sided flank pain. Left renal mass noted on recent CT examination. EXAM: MRI ABDOMEN WITHOUT AND WITH CONTRAST TECHNIQUE: Multiplanar multisequence MR imaging of the abdomen was performed both before and after the administration of intravenous contrast. CONTRAST:  93mL MULTIHANCE GADOBENATE DIMEGLUMINE 529 MG/ML IV SOLN COMPARISON:  No prior abdominal MRI. CT the abdomen and pelvis 08/27/2016. FINDINGS: Lower chest: Unremarkable. Hepatobiliary: The liver is incompletely visualized on several pulse sequences. With these limitations in mind, there are multiple T2 hyperintense lesions in the liver, most of which are T1 hypointense and do not enhance centrally; however, these lesions have an unusual halo of T2  hyperintensity surrounding them, and on the post gadolinium images they. A have a thick rind of subtle enhancement. The exception is a 2.7 x 2.5 cm lesion in segment 6 (image 9 of series 4) which is mildly T1 hyperintense (presumably related to some central hemorrhage or necrosis), T2 hypointense, and otherwise similar in appearance, which clearly demonstrates restricted diffusion. These lesions are suspicious for metastatic lesions. A few other smaller T1 hypointense, T2 hyperintense, nonenhancing lesions are compatible with tiny cysts and/or biliary hamartomas. No intra or extrahepatic biliary ductal dilatation. Gallbladder is nearly completely contracted around 2 large gallstones, measuring up to 16 mm in the fundus. No findings to suggest an acute cholecystitis at this time. Pancreas: Well-circumscribed 6 mm T1 hypointense, T2 hyperintense, nonenhancing lesion in the uncinate process of the pancreas, likely to represent a  tiny pancreatic pseudocyst, but incompletely characterized. No larger more suspicious appearing pancreatic mass. No pancreatic ductal dilatation. No pancreatic or peripancreatic fluid or inflammatory changes. Spleen:  Unremarkable. Adrenals/Urinary Tract: Throughout the mid and upper pole of the left kidney there is a large heterogeneously enhancing mass which measures 5.4 x 5.9 x 5.3 cm (axial image 36 of series 13 and coronal image 32 of series 14) which appears encapsulated within Gerota's fascia, but does appear to extend into the proximal aspect of the left renal vein, best appreciated on axial image 36 of series 13. Right kidney and right adrenal gland are normal in appearance. Heterogeneously enhancing mass in the left adrenal gland, concerning for metastatic lesion, measuring 3.8 x 2.1 cm (axial image 22 of series 13). No hydroureteronephrosis in the visualized portions of the abdomen. Stomach/Bowel: Visualized portions are unremarkable. Vascular/Lymphatic: No aneurysm identified in the  visualized abdominal vasculature. Probable tumor thrombus extending into the proximal aspect of the left renal vein (this does not cross the midline, and does not extend to the IVC). Several enlarged left-sided para-aortic lymph nodes are noted measuring up to 13 mm in short axis adjacent to the left renal hilum (image 40 of series 11). One of these enlarged left para-aortic lymph nodes appears centrally necrotic (12 mm lymph node on image 21 of series 11). Other:  No significant volume of ascites.  No pneumoperitoneum. Musculoskeletal: No aggressive appearing lesions are noted in the visualized portions of the skeleton. IMPRESSION: 1. 5.4 x 5.9 x 5.3 cm left renal mass involving the middle/upper pole with extension into the proximal aspect of the left renal vein (not extending to the IVC), left adrenal metastasis, multiple liver metastases and left periaortic metastatic lymphadenopathy. 2. 6 mm tiny cystic appearing lesion in the uncinate process of the pancreas. Repeat MRI of the abdomen with and without IV gadolinium with MRCP is suggested in 2 years to ensure stability of this lesion. This recommendation follows ACR consensus guidelines: Management of Incidental Pancreatic Cysts: A White Paper of the ACR Incidental Findings Committee. [place] 6962;95:284-132. 3. Additional incidental findings, as above. Electronically Signed   By: Vinnie Langton M.D.   On: 08/30/2016 09:36   Ct Abdomen Pelvis W Contrast  Result Date: 08/27/2016 CLINICAL DATA:  Left lower quadrant pain and low-grade fever for 1 month. EXAM: CT ABDOMEN AND PELVIS WITH CONTRAST TECHNIQUE: Multidetector CT imaging of the abdomen and pelvis was performed using the standard protocol following bolus administration of intravenous contrast. CONTRAST:  176mL ISOVUE-300 IOPAMIDOL (ISOVUE-300) INJECTION 61% COMPARISON:  100 mL Isovue 300 FINDINGS: Lower Chest: Several bibasilar pulmonary nodules are seen bilaterally, largest in the right  lower lobe measuring 5 mm. Hepatobiliary: Several small low-attenuation lesions are seen within the liver, several which are too small to characterize. Several contain irregular margins with possible mild rim enhancement, and have higher attenuation than simple cysts. Several calcified gallstones are seen, without evidence of cholecystitis or biliary ductal dilatation. Pancreas:  No mass or inflammatory changes. Spleen: Within normal limits in size and appearance. Adrenals/Urinary Tract: A heterogeneous mass is seen involving the left adrenal gland which measures 2.6 x 3.1 cm. Ill-defined heterogeneous low-attenuation mass in the upper and mid pole of left kidney measuring 5.2 x 5.8 cm. This shows extension into the left renal sinus and mild dilatation lower pole intrarenal collecting system. This is suspicious for malignancy, with pyelonephritis considered less likely. Stomach/Bowel: No evidence of obstruction, inflammatory process or abnormal fluid collections. Sigmoid diverticulosis noted, without  evidence of diverticulitis. Vascular/Lymphatic: Mild left retroperitoneal lymphadenopathy is seen with largest lymph node in left paraaortic region measuring 12 mm on image 32/2. A partially necrotic celiac axis lymph node is also seen measuring 15 mm on image 21/2. No pelvic lymphadenopathy identified. No abdominal aortic aneurysm.  Aortic atherosclerosis. Reproductive:  No mass identified. Other: Small left inguinal hernia is seen containing only fat. Small right inguinal hernia contains a small portion of the urinary bladder. Musculoskeletal:  No suspicious bone lesions identified. IMPRESSION: 5.8 cm ill-defined heterogeneous mass in the upper and midpole of left kidney. This is suspicious for malignancy, with pyelonephritis considered less likely. Mild left abdominal retroperitoneal and celiac axis lymphadenopathy, suspicious for metastatic disease. 3.1 cm indeterminate left adrenal mass and small indeterminate  low-attenuation liver lesions. Recommend abdomen MRI without and with contrast for further characterization. Indeterminate sub-cm bibasilar pulmonary nodules. Consider chest CT for further evaluation . Additional findings including aortic atherosclerosis, cholelithiasis, colonic diverticulosis, and bilateral inguinal hernias. Electronically Signed   By: Earle Gell M.D.   On: 08/27/2016 14:10    Labs:  CBC:  Recent Labs  09/06/16 1600 09/12/16 0809  WBC 57.3* 81.0*  HGB 10.8* 11.2*  HCT 33.7* 33.6*  PLT 560* 513*    COAGS:  Recent Labs  09/12/16 0809  INR 1.19  APTT 30    BMP:  Recent Labs  09/06/16 1600  NA 130*  K 4.4  CL 98*  CO2 25  GLUCOSE 108*  BUN 24*  CALCIUM 11.4*  CREATININE 1.69*  GFRNONAA 37*  GFRAA 42*    Assessment and Plan:  For US guided liver lesion biopsy.  Imaging reviewed. Inferior right lesion by CT and MRI should be amenable to biopsy.  Risks and benefits discussed with the patient and his wife including, but not limited to, bleeding, infection, damage to adjacent structures or low yield requiring additional tests. All of the patient's questions were answered, patient is agreeable to proceed. Consent signed and in chart.  Thank you for this interesting consult.  I greatly enjoyed meeting SHAWNN BOUILLON and look forward to participating in their care.  A copy of this report was sent to the requesting provider on this date.  Electronically SignedAletta Edouard T 09/12/2016, 9:23 AM     I spent a total of 30 Minutes in face to face in clinical consultation, greater than 50% of which was counseling/coordinating care for liver biopsy.

## 2016-09-12 NOTE — Progress Notes (Signed)
Pt remains clinically stable post biopsy. Wife at bedside. Vitals have remained stable, bandade dressing dry/intact. Discharge instructions given with questions answered.

## 2016-09-12 NOTE — Sedation Documentation (Signed)
Dr. Kathlene Cote notified of lab work prior to procedure

## 2016-09-14 ENCOUNTER — Other Ambulatory Visit: Payer: Self-pay | Admitting: Anatomic Pathology & Clinical Pathology

## 2016-09-15 ENCOUNTER — Other Ambulatory Visit: Payer: Self-pay

## 2016-09-15 ENCOUNTER — Emergency Department: Payer: Medicare Other

## 2016-09-15 ENCOUNTER — Inpatient Hospital Stay
Admission: EM | Admit: 2016-09-15 | Discharge: 2016-09-17 | DRG: 682 | Disposition: A | Discharge status: Hospice | Payer: Medicare Other | Attending: Internal Medicine | Admitting: Internal Medicine

## 2016-09-15 ENCOUNTER — Encounter: Payer: Self-pay | Admitting: Internal Medicine

## 2016-09-15 DIAGNOSIS — E785 Hyperlipidemia, unspecified: Secondary | ICD-10-CM | POA: Diagnosis present

## 2016-09-15 DIAGNOSIS — I10 Essential (primary) hypertension: Secondary | ICD-10-CM | POA: Diagnosis present

## 2016-09-15 DIAGNOSIS — E883 Tumor lysis syndrome: Secondary | ICD-10-CM | POA: Diagnosis not present

## 2016-09-15 DIAGNOSIS — Z66 Do not resuscitate: Secondary | ICD-10-CM | POA: Diagnosis present

## 2016-09-15 DIAGNOSIS — I252 Old myocardial infarction: Secondary | ICD-10-CM | POA: Diagnosis not present

## 2016-09-15 DIAGNOSIS — C7800 Secondary malignant neoplasm of unspecified lung: Secondary | ICD-10-CM | POA: Diagnosis present

## 2016-09-15 DIAGNOSIS — G9340 Encephalopathy, unspecified: Secondary | ICD-10-CM | POA: Diagnosis present

## 2016-09-15 DIAGNOSIS — C649 Malignant neoplasm of unspecified kidney, except renal pelvis: Secondary | ICD-10-CM | POA: Diagnosis present

## 2016-09-15 DIAGNOSIS — F329 Major depressive disorder, single episode, unspecified: Secondary | ICD-10-CM | POA: Diagnosis present

## 2016-09-15 DIAGNOSIS — R45851 Suicidal ideations: Secondary | ICD-10-CM | POA: Diagnosis present

## 2016-09-15 DIAGNOSIS — C787 Secondary malignant neoplasm of liver and intrahepatic bile duct: Secondary | ICD-10-CM | POA: Diagnosis present

## 2016-09-15 DIAGNOSIS — I2510 Atherosclerotic heart disease of native coronary artery without angina pectoris: Secondary | ICD-10-CM | POA: Diagnosis present

## 2016-09-15 DIAGNOSIS — Z87442 Personal history of urinary calculi: Secondary | ICD-10-CM | POA: Diagnosis not present

## 2016-09-15 DIAGNOSIS — Z8249 Family history of ischemic heart disease and other diseases of the circulatory system: Secondary | ICD-10-CM | POA: Diagnosis not present

## 2016-09-15 DIAGNOSIS — N179 Acute kidney failure, unspecified: Secondary | ICD-10-CM | POA: Diagnosis present

## 2016-09-15 DIAGNOSIS — R066 Hiccough: Secondary | ICD-10-CM | POA: Diagnosis present

## 2016-09-15 DIAGNOSIS — C7910 Secondary malignant neoplasm of unspecified urinary organs: Secondary | ICD-10-CM

## 2016-09-15 DIAGNOSIS — R109 Unspecified abdominal pain: Secondary | ICD-10-CM | POA: Diagnosis not present

## 2016-09-15 DIAGNOSIS — F0630 Mood disorder due to known physiological condition, unspecified: Secondary | ICD-10-CM

## 2016-09-15 DIAGNOSIS — Z7982 Long term (current) use of aspirin: Secondary | ICD-10-CM | POA: Diagnosis not present

## 2016-09-15 DIAGNOSIS — Z85828 Personal history of other malignant neoplasm of skin: Secondary | ICD-10-CM | POA: Diagnosis not present

## 2016-09-15 DIAGNOSIS — E790 Hyperuricemia without signs of inflammatory arthritis and tophaceous disease: Secondary | ICD-10-CM

## 2016-09-15 DIAGNOSIS — D72828 Other elevated white blood cell count: Secondary | ICD-10-CM | POA: Diagnosis present

## 2016-09-15 DIAGNOSIS — D72829 Elevated white blood cell count, unspecified: Secondary | ICD-10-CM

## 2016-09-15 DIAGNOSIS — R1084 Generalized abdominal pain: Secondary | ICD-10-CM

## 2016-09-15 DIAGNOSIS — R410 Disorientation, unspecified: Secondary | ICD-10-CM

## 2016-09-15 HISTORY — DX: Acute kidney failure, unspecified: N17.9

## 2016-09-15 HISTORY — DX: Secondary malignant neoplasm of unspecified urinary organs: C79.10

## 2016-09-15 LAB — COMPREHENSIVE METABOLIC PANEL
ALBUMIN: 2.8 g/dL — AB (ref 3.5–5.0)
ALK PHOS: 156 U/L — AB (ref 38–126)
ALT: 30 U/L (ref 17–63)
AST: 36 U/L (ref 15–41)
Anion gap: 9 (ref 5–15)
BILIRUBIN TOTAL: 0.8 mg/dL (ref 0.3–1.2)
BUN: 38 mg/dL — AB (ref 6–20)
CALCIUM: 12.8 mg/dL — AB (ref 8.9–10.3)
CO2: 27 mmol/L (ref 22–32)
Chloride: 99 mmol/L — ABNORMAL LOW (ref 101–111)
Creatinine, Ser: 2.08 mg/dL — ABNORMAL HIGH (ref 0.61–1.24)
GFR calc Af Amer: 33 mL/min — ABNORMAL LOW (ref >60)
GFR calc non Af Amer: 28 mL/min — ABNORMAL LOW (ref >60)
GLUCOSE: 113 mg/dL — AB (ref 65–99)
Potassium: 4.3 mmol/L (ref 3.5–5.1)
Sodium: 135 mmol/L (ref 135–145)
TOTAL PROTEIN: 6.7 g/dL (ref 6.5–8.1)

## 2016-09-15 LAB — CBC WITH DIFFERENTIAL/PLATELET
BASOS ABS: 1.1 10*3/uL — AB (ref 0–0.1)
BASOS PCT: 1 %
EOS ABS: 0 10*3/uL (ref 0–0.7)
Eosinophils Relative: 0 %
HEMATOCRIT: 34.7 % — AB (ref 40.0–52.0)
HEMOGLOBIN: 11.3 g/dL — AB (ref 13.0–18.0)
LYMPHS PCT: 1 %
Lymphs Abs: 1.1 10*3/uL (ref 1.0–3.6)
MCH: 25.9 pg — ABNORMAL LOW (ref 26.0–34.0)
MCHC: 32.5 g/dL (ref 32.0–36.0)
MCV: 79.6 fL — ABNORMAL LOW (ref 80.0–100.0)
Monocytes Absolute: 3.2 10*3/uL — ABNORMAL HIGH (ref 0.2–1.0)
Monocytes Relative: 3 %
NEUTROS PCT: 95 %
Neutro Abs: 102.7 10*3/uL — ABNORMAL HIGH (ref 1.4–6.5)
Platelets: 411 10*3/uL (ref 150–440)
RBC: 4.37 MIL/uL — ABNORMAL LOW (ref 4.40–5.90)
RDW: 15.9 % — ABNORMAL HIGH (ref 11.5–14.5)
WBC: 108.1 10*3/uL (ref 3.8–10.6)

## 2016-09-15 LAB — URIC ACID: URIC ACID, SERUM: 8.4 mg/dL — AB (ref 4.4–7.6)

## 2016-09-15 LAB — URINE DRUG SCREEN, QUALITATIVE (ARMC ONLY)
Amphetamines, Ur Screen: NOT DETECTED
BARBITURATES, UR SCREEN: NOT DETECTED
Benzodiazepine, Ur Scrn: NOT DETECTED
CANNABINOID 50 NG, UR ~~LOC~~: NOT DETECTED
COCAINE METABOLITE, UR ~~LOC~~: NOT DETECTED
MDMA (Ecstasy)Ur Screen: NOT DETECTED
Methadone Scn, Ur: NOT DETECTED
OPIATE, UR SCREEN: POSITIVE — AB
Phencyclidine (PCP) Ur S: NOT DETECTED
Tricyclic, Ur Screen: NOT DETECTED

## 2016-09-15 LAB — SALICYLATE LEVEL: Salicylate Lvl: <7 mg/dL (ref 2.8–30.0)

## 2016-09-15 LAB — PHOSPHORUS: Phosphorus: 2.5 mg/dL (ref 2.5–4.6)

## 2016-09-15 LAB — ACETAMINOPHEN LEVEL: Acetaminophen (Tylenol), Serum: <10 ug/mL — ABNORMAL LOW (ref 10–30)

## 2016-09-15 LAB — RASBURICASE - URIC ACID: Uric Acid, Serum: 8.6 mg/dL — ABNORMAL HIGH (ref 4.4–7.6)

## 2016-09-15 LAB — TROPONIN I: Troponin I: 0.11 ng/mL (ref <0.03)

## 2016-09-15 MED ORDER — SODIUM CHLORIDE 0.9 % IV SOLN
90.0000 mg | Freq: Once | INTRAVENOUS | Status: AC
  Administered 2016-09-15: 90 mg via INTRAVENOUS
  Filled 2016-09-15: qty 10

## 2016-09-15 MED ORDER — SODIUM CHLORIDE 0.9 % IV SOLN: 6.0000 mg | Freq: Once | INTRAVENOUS | Status: DC

## 2016-09-15 MED ORDER — SODIUM CHLORIDE 0.9 % IV BOLUS (SEPSIS)
1000.0000 mL | Freq: Once | INTRAVENOUS | Status: AC
  Administered 2016-09-15: 1000 mL via INTRAVENOUS

## 2016-09-15 MED ORDER — ALLOPURINOL 100 MG PO TABS
200.0000 mg | ORAL_TABLET | Freq: Once | ORAL | Status: AC
  Administered 2016-09-16: 200 mg via ORAL
  Filled 2016-09-15: qty 2

## 2016-09-15 MED ORDER — SODIUM CHLORIDE 0.9 % IV SOLN: 200.0000 mg | Freq: Every day | INTRAVENOUS | Status: DC

## 2016-09-15 NOTE — ED Notes (Signed)
Unsuccessful IV start x1, RAC, Musician

## 2016-09-15 NOTE — BH Assessment (Addendum)
Tele Assessment Note   Michael Brennan is an 83 y.o. male. Information obtained from pt and pt's wife Carlena Hurl [phone]) and family friend. Pt arrives to ED via EMS. Pt found by wife laying outside face up on the ground. EMS uncovered fun in pt's lap. Pt attempted to commit suicide by shooting himself however, firearm jammed. Wife states she was unaware a firearm was in the home. Pt acknowledges suicide attempt and states he was attempting "to end it all". Pt continues to report suicidal ideation with intent. Pt states "I'm old enough and if my wife has to take care of all that stuff it's a great burden for her."  Pt has no previous psychiatric hx. Pt has no h/o suicide attempt. Pt dx w/ cancer and has declined rapidly within the past two weeks. Wife reports pt is suffering from depression and has refused to continue medical testing. Wife and friend report pt was in excellent health and is experiencing difficulty adjusting and coping with dx. Pt states cancer tx and sxs are unbearable and he refuses to be a burden to his wife. Pt denies homicidal ideation and history of violence. Pt denies presence of additional firearm in the home. Pt denies hallucinations. Pt does not appear to be responding to internal stimuli or experiencing delusional thought content.   Wife and friend provided with supportive counseling.   Diagnosis: Depression, single episode  Past Medical History:  Past Medical History:  Diagnosis Date  . AKI (acute kidney injury) ([place]) 09/15/2016  . Arthritis    hips  . Cataract   . History of kidney stones   . Metastatic urothelial carcinoma ([place]) 09/15/2016  . NSTEMI (non-ST elevated myocardial infarction) ([place])   . Skin cancer     Past Surgical History:  Procedure Laterality Date  . APPENDECTOMY  1950's  . CARDIAC CATHETERIZATION N/A 12/30/2014   Procedure: Left Heart Cath;  Surgeon: Isaias Cowman, MD;  Location: [facility];  Service: Cardiovascular;   Laterality: N/A;  . COLONOSCOPY WITH PROPOFOL N/A 08/25/2015   Procedure: COLONOSCOPY WITH PROPOFOL;  Surgeon: Lucilla Lame, MD;  Location: [facility];  Service: Endoscopy;  Laterality: N/A;  . ESOPHAGOGASTRODUODENOSCOPY (EGD) WITH PROPOFOL N/A 08/25/2015   Procedure: ESOPHAGOGASTRODUODENOSCOPY (EGD) WITH PROPOFOL;  Surgeon: Lucilla Lame, MD;  Location: [facility];  Service: Endoscopy;  Laterality: N/A;    Family History:  Family History  Problem Relation Age of Onset  . Heart attack Maternal Uncle   . Alcohol abuse Neg Hx   . Arthritis Neg Hx   . Asthma Neg Hx   . Birth defects Neg Hx   . Cancer Neg Hx   . COPD Neg Hx   . Depression Neg Hx   . Drug abuse Neg Hx   . Diabetes Neg Hx   . Early death Neg Hx   . Hearing loss Neg Hx   . Heart disease Neg Hx   . Hyperlipidemia Neg Hx   . Hypertension Neg Hx   . Kidney disease Neg Hx   . Learning disabilities Neg Hx   . Mental illness Neg Hx   . Mental retardation Neg Hx   . Miscarriages / Stillbirths Neg Hx   . Stroke Neg Hx   . Vision loss Neg Hx   . Varicose Veins Neg Hx     Social History:  reports that he has never smoked. He has never used smokeless tobacco. He reports that he drinks about 3.6 oz of alcohol per week .  He reports that he does not use drugs.  Additional Social History:  Alcohol / Drug Use Pain Medications: Pt denies abuse. Prescriptions: Pt denies abuse. Over the Counter: Pt denies abuse. History of alcohol / drug use?: No history of alcohol / drug abuse  CIWA: CIWA-Ar BP: (!) 153/81 Pulse Rate: (!) 108 COWS:    PATIENT STRENGTHS: (choose at least two) Average or above average intelligence General fund of knowledge Supportive family/friends  Allergies: No Known Allergies  Home Medications:  (Not in a hospital admission)  OB/GYN Status:  No LMP for male patient.  General Assessment Data Location of Assessment: [facility] ED TTS Assessment: In system Is this a Tele or Face-to-Face  Assessment?: Face-to-Face Is this an Initial Assessment or a Re-assessment for this encounter?: Initial Assessment Marital status: Married Is patient pregnant?: No Pregnancy Status: No Living Arrangements: Spouse/significant other Can pt return to current living arrangement?:  (Spouse unable to adequately care for pt) Admission Status: Involuntary Is patient capable of signing voluntary admission?: No Referral Source: Other (EMS ) Insurance type: medicare     Crisis Care Plan Living Arrangements: Spouse/significant other Name of Psychiatrist: None Name of Therapist: None  Education Status Is patient currently in school?: No Highest grade of school patient has completed: High School  Risk to self with the past 6 months Suicidal Ideation: Yes-Currently Present Has patient been a risk to self within the past 6 months prior to admission? : Yes Suicidal Intent: Yes-Currently Present Has patient had any suicidal intent within the past 6 months prior to admission? : Yes Is patient at risk for suicide?: Yes Suicidal Plan?: Yes-Currently Present Has patient had any suicidal plan within the past 6 months prior to admission? : No Specify Current Suicidal Plan: Pt attempted to shoot self pta Access to Means: Yes Specify Access to Suicidal Means: access to firearm What has been your use of drugs/alcohol within the last 12 months?: Pt denies substance use Previous Attempts/Gestures: No Other Self Harm Risks: Medical Illness Intentional Self Injurious Behavior: None Family Suicide History: No Recent stressful life event(s): Other (Comment) (Medical Illness) Persecutory voices/beliefs?: No Depression: Yes (pt denies) Depression Symptoms: Fatigue, Despondent Substance abuse history and/or treatment for substance abuse?: No Suicide prevention information given to non-admitted patients: Not applicable  Risk to Others within the past 6 months Homicidal Ideation: No Does patient have any  lifetime risk of violence toward others beyond the six months prior to admission? : No Thoughts of Harm to Others: No Current Homicidal Intent: No Current Homicidal Plan: No Access to Homicidal Means: No History of harm to others?: No Assessment of Violence: None Noted Does patient have access to weapons?: No Criminal Charges Pending?: No Does patient have a court date: No Is patient on probation?: No  Psychosis Hallucinations: None noted Delusions: None noted  Mental Status Report Appearance/Hygiene: In hospital gown Eye Contact: Good Motor Activity: Tremors Speech: Logical/coherent Level of Consciousness: Alert Mood: Depressed Affect: Depressed Anxiety Level: Minimal Thought Processes: Coherent, Relevant Judgement: Unimpaired Orientation: Person, Place, Situation Obsessive Compulsive Thoughts/Behaviors: None  Cognitive Functioning Concentration: Normal Memory: Recent Intact, Remote Intact IQ: Average Insight: Good Impulse Control: Fair Appetite: Poor Weight Loss:  (Wife reports ample weight loss) Weight Gain: 0 Sleep: Decreased Total Hours of Sleep:  (unable to quatify but logs in fitbit) Vegetative Symptoms: Staying in bed (stays in chair- decreased ambulation due to cancer sxs)  ADLScreening [facility] Assessment Services) Patient's cognitive ability adequate to safely complete daily activities?: Yes Patient able to express  need for assistance with ADLs?: Yes Independently performs ADLs?: No  Prior Inpatient Therapy Prior Inpatient Therapy: No  Prior Outpatient Therapy Prior Outpatient Therapy: No Does patient have an ACCT team?: No Does patient have Intensive In-House Services?  : No Does patient have [facility] services? : No Does patient have P4CC services?: No  ADL Screening (condition at time of admission) Patient's cognitive ability adequate to safely complete daily activities?: Yes Is the patient deaf or have difficulty hearing?: No Does the patient have  difficulty seeing, even when wearing glasses/contacts?: No Does the patient have difficulty concentrating, remembering, or making decisions?: No Patient able to express need for assistance with ADLs?: Yes Does the patient have difficulty dressing or bathing?: Yes Independently performs ADLs?: No Communication: Independent Dressing (OT): Needs assistance Grooming: Needs assistance Feeding: Independent Bathing: Needs assistance Toileting: Needs assistance In/Out Bed: Needs assistance Walks in Home: Needs assistance Does the patient have difficulty walking or climbing stairs?: Yes Weakness of Legs: Both Weakness of Arms/Hands: Both  Home Assistive Devices/Equipment Home Assistive Devices/Equipment: None  Therapy Consults (therapy consults require a physician order) PT Evaluation Needed: No OT Evalulation Needed: No SLP Evaluation Needed: No Abuse/Neglect Assessment (Assessment to be complete while patient is alone) Physical Abuse: Denies Verbal Abuse: Denies Sexual Abuse: Denies Exploitation of patient/patient's resources: Denies Self-Neglect: Denies Values / Beliefs Cultural Requests During Hospitalization: None Spiritual Requests During Hospitalization: None Consults Spiritual Care Consult Needed: No Social Work Consult Needed: No Regulatory affairs officer (For Healthcare) Does Patient Have a Medical Advance Directive?: No Would patient like information on creating a medical advance directive?: No - Patient declined    Additional Information 1:1 In Past 12 Months?: No CIRT Risk: No Elopement Risk: No Does patient have medical clearance?: No     Disposition:  Disposition Initial Assessment Completed for this Encounter: Yes Disposition of Patient: Other dispositions Other disposition(s): Other (Comment) (pending psychiatric recommendation)  Darrio Bade J [place] 09/15/2016 10:10 PM
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
## 2016-09-15 NOTE — ED Notes (Signed)
Pt given a cup of water with MD permission. Pt is no longer IVC per MD. This tech has informed family and RN of the change of status, pt will be downgraded to hourly rounding at this time

## 2016-09-15 NOTE — ED Notes (Signed)
Pharmacy notified me they do not have any means to get rasburicase, MD notified.

## 2016-09-15 NOTE — Progress Notes (Signed)
This patient is known to be as an outpatient and was seen by me on 09/06/16 as initial consult. At that time he had liver mets, left kidney mass and WBC of 57.3 along with anemia and thrombocytosis. Differential on cbc was mainly neutrophilia.  Liver biopsy was reported yesterday as upper urothelial carcinoma  Patient was admitted for encephalopathy due to hypercalcemia and wbc of 108 that is predominantly neutrophilia  I have spoken to ER physician Dr. Quentin Cornwall as well as patient and his wife over the phone. I did initially suggest he should be transferred to [facility] or [facility] given encephalopathy and abnormal bloodwork. Patient and his wife would like to preferably stay here at [facility].   Patient has an aggressive rapidly growing tumor which is causing profound reactive neutrophilia. This is not consistent with acute leukemia and does not need leukapheresis.  I did recommend that he should be given pamidronate for hypercalcemia along with IVF and rasburicase for hyperuricemia although other labs not consistent with TLS.  I will see the patient in am and discuss pathology and prognosis with him in detail. Will tentatively plan to give him 1 dose of chemotherapy on Monday after his calcium and renal functions improve further.   Please obtain infectious work up including blood cultures to rule out any infection.  Dr. Randa Evens, MD, MPH [facility] [phone] 09/15/2016 10:33 PM

## 2016-09-15 NOTE — ED Notes (Signed)
Called pharmacy to get this medication for [place]

## 2016-09-15 NOTE — H&P (Signed)
[facility] NAME: [name]    MR#:  [id number]  DATE OF BIRTH:  Dec 26, 1933  DATE OF ADMISSION:  09/15/2016  PRIMARY CARE PHYSICIAN: Juluis Pitch, MD   REQUESTING/REFERRING PHYSICIAN: Quentin Cornwall, MD  CHIEF COMPLAINT:   Chief Complaint  Patient presents with  . Medical Clearance    HISTORY OF PRESENT ILLNESS:  Michael Brennan  is a 83 y.o. male who presents with Suicidal gestures. Patient was found at home somewhat confused, with a firearm and suicidal threats. Patient has a recent diagnosis of metastatic urothelial cancer. He comes in tonight with significant electrolyte derangements including significantly elevated calcium.  He also has a severe leukocytosis and elevated uric acid. Symptoms and labs concerning for tumor lysis syndrome. Oncology contacted by ED provider and recommends treatment with pamidronate, rasburicase, and they will see the patient tomorrow to initiate treatment for his cancer.  PAST MEDICAL HISTORY:   Past Medical History:  Diagnosis Date  . AKI (acute kidney injury) ([place]) 09/15/2016  . Arthritis    hips  . Cataract   . History of kidney stones   . Metastatic urothelial carcinoma ([place]) 09/15/2016  . NSTEMI (non-ST elevated myocardial infarction) ([place])   . Skin cancer     PAST SURGICAL HISTORY:   Past Surgical History:  Procedure Laterality Date  . APPENDECTOMY  1950's  . CARDIAC CATHETERIZATION N/A 12/30/2014   Procedure: Left Heart Cath;  Surgeon: Isaias Cowman, MD;  Location: [facility];  Service: Cardiovascular;  Laterality: N/A;  . COLONOSCOPY WITH PROPOFOL N/A 08/25/2015   Procedure: COLONOSCOPY WITH PROPOFOL;  Surgeon: Lucilla Lame, MD;  Location: [facility];  Service: Endoscopy;  Laterality: N/A;  . ESOPHAGOGASTRODUODENOSCOPY (EGD) WITH PROPOFOL N/A 08/25/2015   Procedure: ESOPHAGOGASTRODUODENOSCOPY (EGD) WITH PROPOFOL;  Surgeon: Lucilla Lame, MD;  Location:  [facility];  Service: Endoscopy;  Laterality: N/A;    SOCIAL HISTORY:   Social History  Substance Use Topics  . Smoking status: Never Smoker  . Smokeless tobacco: Never Used  . Alcohol use 3.6 oz/week    6 Cans of beer per week     Comment: has not been drinking alcohol last few weeks    FAMILY HISTORY:   Family History  Problem Relation Age of Onset  . Heart attack Maternal Uncle   . Alcohol abuse Neg Hx   . Arthritis Neg Hx   . Asthma Neg Hx   . Birth defects Neg Hx   . Cancer Neg Hx   . COPD Neg Hx   . Depression Neg Hx   . Drug abuse Neg Hx   . Diabetes Neg Hx   . Early death Neg Hx   . Hearing loss Neg Hx   . Heart disease Neg Hx   . Hyperlipidemia Neg Hx   . Hypertension Neg Hx   . Kidney disease Neg Hx   . Learning disabilities Neg Hx   . Mental illness Neg Hx   . Mental retardation Neg Hx   . Miscarriages / Stillbirths Neg Hx   . Stroke Neg Hx   . Vision loss Neg Hx   . Varicose Veins Neg Hx     DRUG ALLERGIES:  No Known Allergies  MEDICATIONS AT HOME:   Prior to Admission medications   Medication Sig Start Date End Date Taking? Authorizing Provider  aspirin 81 MG chewable tablet Chew by mouth. 12/31/14  Yes Historical Provider, MD  levofloxacin (LEVAQUIN) 750 MG tablet  Take 750 mg by mouth daily.   Yes Historical Provider, MD  oxyCODONE (OXY IR/ROXICODONE) 5 MG immediate release tablet Take 1 tablet (5 mg total) by mouth every 4 (four) hours as needed for severe pain. 09/06/16  Yes Sindy Guadeloupe, MD  nitroGLYCERIN (NITROSTAT) 0.4 MG SL tablet Place 1 tablet (0.4 mg total) under the tongue every 5 (five) minutes as needed for chest pain (if having Chest pain after 3 doses call MD/911). Patient not taking: Reported on 08/18/2015 12/31/14   Bettey Costa, MD    REVIEW OF SYSTEMS:  Review of Systems  Constitutional: Positive for malaise/fatigue. Negative for chills, fever and weight loss.  HENT: Negative for ear pain, hearing loss and tinnitus.    Eyes: Negative for blurred vision, double vision, pain and redness.  Respiratory: Negative for cough, hemoptysis and shortness of breath.   Cardiovascular: Negative for chest pain, palpitations, orthopnea and leg swelling.  Gastrointestinal: Negative for abdominal pain, constipation, diarrhea, nausea and vomiting.  Genitourinary: Negative for dysuria, frequency and hematuria.  Musculoskeletal: Negative for back pain, joint pain and neck pain.  Skin:       No acne, rash, or lesions  Neurological: Negative for dizziness, tremors, focal weakness and weakness.       Confusion  Endo/Heme/Allergies: Negative for polydipsia. Does not bruise/bleed easily.  Psychiatric/Behavioral: Positive for suicidal ideas. The patient is not nervous/anxious and does not have insomnia.      VITAL SIGNS:   Vitals:   09/15/16 1746 09/15/16 1748  BP: (!) 153/81   Pulse: (!) 108   Resp: (!) 22   SpO2: 99%   Weight:  74.8 kg (165 lb)  Height:  5\' 7"  (1.702 m)   Wt Readings from Last 3 Encounters:  09/15/16 74.8 kg (165 lb)  09/06/16 74.7 kg (164 lb 12.7 oz)  08/25/15 75.3 kg (166 lb)    PHYSICAL EXAMINATION:  Physical Exam  Vitals reviewed. Constitutional: He is oriented to person, place, and time. He appears well-developed and well-nourished. No distress.  HENT:  Head: Normocephalic and atraumatic.  Mouth/Throat: Oropharynx is clear and moist.  Eyes: Conjunctivae and EOM are normal. Pupils are equal, round, and reactive to light. No scleral icterus.  Neck: Normal range of motion. Neck supple. No JVD present. No thyromegaly present.  Cardiovascular: Normal rate, regular rhythm and intact distal pulses.  Exam reveals no gallop and no friction rub.   No murmur heard. Respiratory: Effort normal and breath sounds normal. No respiratory distress. He has no wheezes. He has no rales.  GI: Soft. Bowel sounds are normal. He exhibits no distension. There is no tenderness.  Musculoskeletal: Normal range of  motion. He exhibits no edema.  No arthritis, no gout  Lymphadenopathy:    He has no cervical adenopathy.  Neurological: He is alert and oriented to person, place, and time. No cranial nerve deficit.  No dysarthria, no aphasia  Skin: Skin is warm and dry. No rash noted. No erythema.  Psychiatric:  Difficult to fully assess    LABORATORY PANEL:   CBC  Recent Labs Lab 09/15/16 1815  WBC 108.1*  HGB 11.3*  HCT 34.7*  PLT 411   ------------------------------------------------------------------------------------------------------------------  Chemistries   Recent Labs Lab 09/15/16 1815  NA 135  K 4.3  CL 99*  CO2 27  GLUCOSE 113*  BUN 38*  CREATININE 2.08*  CALCIUM 12.8*  AST 36  ALT 30  ALKPHOS 156*  BILITOT 0.8   ------------------------------------------------------------------------------------------------------------------  Cardiac Enzymes  Recent Labs Lab  09/15/16 1815  TROPONINI 0.11*   ------------------------------------------------------------------------------------------------------------------  RADIOLOGY:  Ct Abdomen Pelvis Wo Contrast  Result Date: 09/15/2016 CLINICAL DATA:  Metastatic renal cell carcinoma. Generalized abdominal pain. EXAM: CT CHEST, ABDOMEN AND PELVIS WITHOUT CONTRAST TECHNIQUE: Multidetector CT imaging of the chest, abdomen and pelvis was performed following the standard protocol without IV contrast. COMPARISON:  CT from 08/27/2016 FINDINGS: CT CHEST FINDINGS Cardiovascular: The heart size is normal. No pericardial effusion. Coronary stent noted. Atherosclerotic calcification is noted in the wall of the thoracic aorta. Mediastinum/Nodes: No mediastinal lymphadenopathy. No evidence for gross hilar lymphadenopathy although assessment is limited by the lack of intravenous contrast on today's study. The esophagus has normal imaging features. There is no axillary lymphadenopathy. Lungs/Pleura: Multiple bilateral pulmonary nodules are  identified. One of the dominant right-sided nodules is identified anterior right lower lobe (image 99 series 4) and measures 17 mm. Dominant left-sided nodule is identified in the upper lobe and measures 18 mm. Other scattered bilateral pulmonary nodules range in size from 3-4 mm up to about 16 mm. Musculoskeletal: Bone windows reveal no worrisome lytic or sclerotic osseous lesions. CT ABDOMEN PELVIS FINDINGS Hepatobiliary: Multiple liver lesions are evident, but difficult to assess given the lack of intravenous contrast. 2.8 cm lesion identified anterior liver on image 55. 2.2 cm lesion identified inferior right liver image 68. Other liver lesions are seen scattered in both hepatic lobes. Calcified gallstones evident. No intrahepatic or extrahepatic biliary dilation. Pancreas: No focal mass lesion. No dilatation of the main duct. No intraparenchymal cyst. No peripancreatic edema. Spleen: No splenomegaly. No focal mass lesion. Adrenals/Urinary Tract: Right adrenal gland unremarkable. Left adrenal lesion measures 4.5 x 3.4 cm today compared to 3.1 x 2.6 cm previously. Left upper pole renal mass difficult to measure given lack of intravenous contrast. It is probably about 6.4 x 6.0 cm today compared to 5.2 x 5.8 cm previously. Right kidney unremarkable. No hydroureter. The urinary bladder appears normal for the degree of distention. Stomach/Bowel: Stomach is nondistended. No gastric wall thickening. No evidence of outlet obstruction. Duodenum is normally positioned as is the ligament of Treitz. No small bowel wall thickening. No small bowel dilatation. The terminal ileum is normal. The appendix is not visualized, but there is no edema or inflammation in the region of the cecum. Diverticular changes are noted in the left colon without evidence of diverticulitis. Vascular/Lymphatic: There is abdominal aortic atherosclerosis without aneurysm. Retroperitoneal lymphadenopathy is progressed. 15 mm left para-aortic lymph  node measured previously is now 23 mm. 12 mm short axis lymph node measured previously is now 13 mm. 13 mm left para-aortic lymph node seen on image 68 series 2 was 9 mm when remeasured on prior study. Reproductive: Prostate gland is enlarged. Other: No intraperitoneal free fluid. Musculoskeletal: Bone windows reveal no worrisome lytic or sclerotic osseous lesions. IMPRESSION: 1. Multiple bilateral pulmonary nodules consistent with metastatic disease. 2. Interval progression of the left upper pole renal mass previously characterized on contrast infused CT and MRI. 3. Interval progression of metastatic disease in the liver and left para-aortic space. 4. Left colonic diverticulosis without diverticulitis. 5.  Abdominal Aortic Atherosclerois (ICD10-170.0) Electronically Signed   By: Misty Stanley M.D.   On: 09/15/2016 20:34   Ct Head Wo Contrast  Result Date: 09/15/2016 CLINICAL DATA:  83 year old male with confusion and altered mental status. History of metastatic renal cell carcinoma. EXAM: CT HEAD WITHOUT CONTRAST TECHNIQUE: Contiguous axial images were obtained from the base of the skull through the vertex without intravenous contrast.  COMPARISON:  None. FINDINGS: Brain: Small right posterior parietal infarct is age indeterminate but probably remote. Mild generalized cerebral volume loss and mild probable chronic small-vessel white matter ischemic changes noted. There is no evidence of hemorrhage, midline shift, hydrocephalus or extra-axial collection. Vascular:  Mild intracranial atherosclerotic calcifications noted. Skull: Normal. Negative for fracture or focal lesion. Sinuses/Orbits: No acute finding. Other: None. 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 No evidence of hemorrhage. Mild atrophy and chronic small-vessel white matter ischemic changes. Electronically Signed   By: Margarette Canada M.D.   On: 09/15/2016 20:14   Ct Chest Wo Contrast  Result Date:  09/15/2016 CLINICAL DATA:  Metastatic renal cell carcinoma. Generalized abdominal pain. EXAM: CT CHEST, ABDOMEN AND PELVIS WITHOUT CONTRAST TECHNIQUE: Multidetector CT imaging of the chest, abdomen and pelvis was performed following the standard protocol without IV contrast. COMPARISON:  CT from 08/27/2016 FINDINGS: CT CHEST FINDINGS Cardiovascular: The heart size is normal. No pericardial effusion. Coronary stent noted. Atherosclerotic calcification is noted in the wall of the thoracic aorta. Mediastinum/Nodes: No mediastinal lymphadenopathy. No evidence for gross hilar lymphadenopathy although assessment is limited by the lack of intravenous contrast on today's study. The esophagus has normal imaging features. There is no axillary lymphadenopathy. Lungs/Pleura: Multiple bilateral pulmonary nodules are identified. One of the dominant right-sided nodules is identified anterior right lower lobe (image 99 series 4) and measures 17 mm. Dominant left-sided nodule is identified in the upper lobe and measures 18 mm. Other scattered bilateral pulmonary nodules range in size from 3-4 mm up to about 16 mm. Musculoskeletal: Bone windows reveal no worrisome lytic or sclerotic osseous lesions. CT ABDOMEN PELVIS FINDINGS Hepatobiliary: Multiple liver lesions are evident, but difficult to assess given the lack of intravenous contrast. 2.8 cm lesion identified anterior liver on image 55. 2.2 cm lesion identified inferior right liver image 68. Other liver lesions are seen scattered in both hepatic lobes. Calcified gallstones evident. No intrahepatic or extrahepatic biliary dilation. Pancreas: No focal mass lesion. No dilatation of the main duct. No intraparenchymal cyst. No peripancreatic edema. Spleen: No splenomegaly. No focal mass lesion. Adrenals/Urinary Tract: Right adrenal gland unremarkable. Left adrenal lesion measures 4.5 x 3.4 cm today compared to 3.1 x 2.6 cm previously. Left upper pole renal mass difficult to measure  given lack of intravenous contrast. It is probably about 6.4 x 6.0 cm today compared to 5.2 x 5.8 cm previously. Right kidney unremarkable. No hydroureter. The urinary bladder appears normal for the degree of distention. Stomach/Bowel: Stomach is nondistended. No gastric wall thickening. No evidence of outlet obstruction. Duodenum is normally positioned as is the ligament of Treitz. No small bowel wall thickening. No small bowel dilatation. The terminal ileum is normal. The appendix is not visualized, but there is no edema or inflammation in the region of the cecum. Diverticular changes are noted in the left colon without evidence of diverticulitis. Vascular/Lymphatic: There is abdominal aortic atherosclerosis without aneurysm. Retroperitoneal lymphadenopathy is progressed. 15 mm left para-aortic lymph node measured previously is now 23 mm. 12 mm short axis lymph node measured previously is now 13 mm. 13 mm left para-aortic lymph node seen on image 68 series 2 was 9 mm when remeasured on prior study. Reproductive: Prostate gland is enlarged. Other: No intraperitoneal free fluid. Musculoskeletal: Bone windows reveal no worrisome lytic or sclerotic osseous lesions. IMPRESSION: 1. Multiple bilateral pulmonary nodules consistent with metastatic disease. 2. Interval progression of the left upper pole renal mass previously characterized on contrast infused CT  and MRI. 3. Interval progression of metastatic disease in the liver and left para-aortic space. 4. Left colonic diverticulosis without diverticulitis. 5.  Abdominal Aortic Atherosclerois (ICD10-170.0) Electronically Signed   By: Misty Stanley M.D.   On: 09/15/2016 20:34    EKG:   Orders placed or performed during the hospital encounter of 09/15/16  . ED EKG  . ED EKG    IMPRESSION AND PLAN:  Principal Problem:   Tumor lysis syndrome - treatment started, oncology consulted, patient is stable at this time Active Problems:   Metastatic urothelial carcinoma  [facility]) - oncology consulted with plan to start treatment tomorrow, this will depend on the patient's psychiatric evaluation and treatment wishes, see below.   Leukocytosis - like paraneoplastic leukocytosis related to his cancer and above problems   Suicidal ideation - patient states that he does not wish to undergo any further treatments for his cancer. Given his elevated calcium and confusion this decision is unreliable this time, despite the fact that with initial treatment he has become more oriented in the ED. Or, we'll get a psychiatry consult in the morning for further evaluation   AKI (acute kidney injury) ([place]) - related to his tumor lysis syndrome above, hydrate tonight and avoid nephrotoxins   CAD (coronary artery disease) - continue home meds  All the records are reviewed and case discussed with ED provider. Management plans discussed with the patient and/or family.  DVT PROPHYLAXIS: SubQ heparin  GI PROPHYLAXIS: None  ADMISSION STATUS: Inpatient  CODE STATUS: Full Code Status History    Date Active Date Inactive Code Status Order ID Comments User Context   12/30/2014  4:48 PM 12/31/2014  2:57 PM Full Code [id number]  Isaias Cowman, MD Inpatient   12/29/2014  6:10 PM 12/30/2014  4:48 PM Full Code [id number]  Theodoro Grist, MD Inpatient      TOTAL TIME TAKING CARE OF THIS PATIENT: 45 minutes.    Keamber Macfadden [place] 09/15/2016, 10:09 PM  [facility]  Office  [phone]  CC: Primary care physician; Juluis Pitch, MD

## 2016-09-15 NOTE — ED Notes (Signed)
Pt currently lying in bed calm with 3 visitors in room.

## 2016-09-15 NOTE — ED Notes (Signed)
Notified pharmacy to send Allopurinol.
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
## 2016-09-16 DIAGNOSIS — M129 Arthropathy, unspecified: Secondary | ICD-10-CM

## 2016-09-16 DIAGNOSIS — R1900 Intra-abdominal and pelvic swelling, mass and lump, unspecified site: Secondary | ICD-10-CM

## 2016-09-16 DIAGNOSIS — F0630 Mood disorder due to known physiological condition, unspecified: Secondary | ICD-10-CM

## 2016-09-16 DIAGNOSIS — G9340 Encephalopathy, unspecified: Secondary | ICD-10-CM

## 2016-09-16 DIAGNOSIS — R61 Generalized hyperhidrosis: Secondary | ICD-10-CM

## 2016-09-16 DIAGNOSIS — C787 Secondary malignant neoplasm of liver and intrahepatic bile duct: Secondary | ICD-10-CM

## 2016-09-16 DIAGNOSIS — R509 Fever, unspecified: Secondary | ICD-10-CM

## 2016-09-16 DIAGNOSIS — E883 Tumor lysis syndrome: Principal | ICD-10-CM

## 2016-09-16 DIAGNOSIS — N281 Cyst of kidney, acquired: Secondary | ICD-10-CM

## 2016-09-16 DIAGNOSIS — R45851 Suicidal ideations: Secondary | ICD-10-CM

## 2016-09-16 DIAGNOSIS — R634 Abnormal weight loss: Secondary | ICD-10-CM

## 2016-09-16 DIAGNOSIS — Z79899 Other long term (current) drug therapy: Secondary | ICD-10-CM

## 2016-09-16 DIAGNOSIS — R109 Unspecified abdominal pain: Secondary | ICD-10-CM

## 2016-09-16 DIAGNOSIS — R5383 Other fatigue: Secondary | ICD-10-CM

## 2016-09-16 DIAGNOSIS — C7800 Secondary malignant neoplasm of unspecified lung: Secondary | ICD-10-CM

## 2016-09-16 DIAGNOSIS — Z85828 Personal history of other malignant neoplasm of skin: Secondary | ICD-10-CM

## 2016-09-16 DIAGNOSIS — C801 Malignant (primary) neoplasm, unspecified: Secondary | ICD-10-CM

## 2016-09-16 DIAGNOSIS — Z87442 Personal history of urinary calculi: Secondary | ICD-10-CM

## 2016-09-16 DIAGNOSIS — I252 Old myocardial infarction: Secondary | ICD-10-CM

## 2016-09-16 DIAGNOSIS — D72829 Elevated white blood cell count, unspecified: Secondary | ICD-10-CM

## 2016-09-16 DIAGNOSIS — I2510 Atherosclerotic heart disease of native coronary artery without angina pectoris: Secondary | ICD-10-CM

## 2016-09-16 LAB — TROPONIN I
Troponin I: 0.11 ng/mL (ref <0.03)
Troponin I: 0.11 ng/mL (ref <0.03)

## 2016-09-16 LAB — CBC
HEMATOCRIT: 29.6 % — AB (ref 40.0–52.0)
HEMOGLOBIN: 9.6 g/dL — AB (ref 13.0–18.0)
MCH: 25.8 pg — AB (ref 26.0–34.0)
MCHC: 32.5 g/dL (ref 32.0–36.0)
MCV: 79.6 fL — ABNORMAL LOW (ref 80.0–100.0)
Platelets: 367 10*3/uL (ref 150–440)
RBC: 3.72 MIL/uL — ABNORMAL LOW (ref 4.40–5.90)
RDW: 16 % — ABNORMAL HIGH (ref 11.5–14.5)
WBC: 91.5 10*3/uL (ref 3.8–10.6)

## 2016-09-16 LAB — BASIC METABOLIC PANEL
Anion gap: 4 — ABNORMAL LOW (ref 5–15)
BUN: 30 mg/dL — AB (ref 6–20)
CHLORIDE: 106 mmol/L (ref 101–111)
CO2: 24 mmol/L (ref 22–32)
CREATININE: 1.69 mg/dL — AB (ref 0.61–1.24)
Calcium: 11.4 mg/dL — ABNORMAL HIGH (ref 8.9–10.3)
GFR calc Af Amer: 42 mL/min — ABNORMAL LOW (ref >60)
GFR calc non Af Amer: 37 mL/min — ABNORMAL LOW (ref >60)
Glucose, Bld: 89 mg/dL (ref 65–99)
Potassium: 3.9 mmol/L (ref 3.5–5.1)
Sodium: 134 mmol/L — ABNORMAL LOW (ref 135–145)

## 2016-09-16 LAB — URIC ACID: Uric Acid, Serum: 6.4 mg/dL (ref 4.4–7.6)

## 2016-09-16 MED ORDER — SODIUM CHLORIDE 0.9% FLUSH
3.0000 mL | Freq: Two times a day (BID) | INTRAVENOUS | Status: DC
  Administered 2016-09-16 (×2): 3 mL via INTRAVENOUS

## 2016-09-16 MED ORDER — CHLORPROMAZINE HCL 25 MG PO TABS
25.0000 mg | ORAL_TABLET | Freq: Three times a day (TID) | ORAL | Status: DC
  Administered 2016-09-16 – 2016-09-17 (×2): 25 mg via ORAL
  Filled 2016-09-16 (×3): qty 1

## 2016-09-16 MED ORDER — OXYCODONE HCL 5 MG PO TABS: 5.0000 mg | ORAL_TABLET | ORAL | Status: DC | PRN

## 2016-09-16 MED ORDER — ONDANSETRON HCL 4 MG/2ML IJ SOLN: 4.0000 mg | Freq: Four times a day (QID) | INTRAMUSCULAR | Status: DC | PRN

## 2016-09-16 MED ORDER — ACETAMINOPHEN 650 MG RE SUPP: 650.0000 mg | Freq: Four times a day (QID) | RECTAL | Status: DC | PRN

## 2016-09-16 MED ORDER — SODIUM CHLORIDE 0.9 % IV SOLN
INTRAVENOUS | Status: DC
  Administered 2016-09-16: 04:00:00 via INTRAVENOUS

## 2016-09-16 MED ORDER — ONDANSETRON HCL 4 MG PO TABS: 4.0000 mg | ORAL_TABLET | Freq: Four times a day (QID) | ORAL | Status: DC | PRN

## 2016-09-16 MED ORDER — CHLORPROMAZINE HCL 25 MG PO TABS
25.0000 mg | ORAL_TABLET | Freq: Once | ORAL | Status: AC
  Administered 2016-09-16: 12:00:00 25 mg via ORAL
  Filled 2016-09-16: qty 1

## 2016-09-16 MED ORDER — CHLORPROMAZINE HCL 25 MG PO TABS
25.0000 mg | ORAL_TABLET | Freq: Three times a day (TID) | ORAL | Status: DC | PRN
  Administered 2016-09-16: 10:00:00 25 mg via ORAL
  Filled 2016-09-16: qty 1

## 2016-09-16 MED ORDER — MORPHINE SULFATE (PF) 2 MG/ML IV SOLN
2.0000 mg | INTRAVENOUS | Status: DC | PRN
  Filled 2016-09-16: qty 1

## 2016-09-16 MED ORDER — ASPIRIN 81 MG PO CHEW
81.0000 mg | CHEWABLE_TABLET | Freq: Every day | ORAL | Status: DC
  Administered 2016-09-17: 15:00:00 81 mg via ORAL
  Filled 2016-09-16 (×2): qty 1

## 2016-09-16 MED ORDER — HEPARIN SODIUM (PORCINE) 5000 UNIT/ML IJ SOLN
5000.0000 [IU] | Freq: Three times a day (TID) | INTRAMUSCULAR | Status: DC
  Administered 2016-09-16: 07:00:00 5000 [IU] via SUBCUTANEOUS
  Filled 2016-09-16: qty 1

## 2016-09-16 MED ORDER — ACETAMINOPHEN 325 MG PO TABS: 650.0000 mg | ORAL_TABLET | Freq: Four times a day (QID) | ORAL | Status: DC | PRN

## 2016-09-16 MED ORDER — ENSURE ENLIVE PO LIQD
237.0000 mL | Freq: Three times a day (TID) | ORAL | Status: DC
  Administered 2016-09-16 – 2016-09-17 (×3): 237 mL via ORAL

## 2016-09-16 NOTE — Progress Notes (Signed)
[facility] NAME: Michael Brennan    MR#:  [id number]  DATE OF BIRTH:  [birth year]  SUBJECTIVE:  CHIEF COMPLAINT:   Chief Complaint  Patient presents with  . Medical Clearance   - Recent diagnosis of metastatic urothelial carcinoma, presenting with elevated uric acid and hypercalcemia. -Patient is completely alert and oriented and requests hospice  REVIEW OF SYSTEMS:  Review of Systems  Constitutional: Positive for malaise/fatigue. Negative for chills and fever.  HENT: Negative for congestion, ear discharge, hearing loss and nosebleeds.   Eyes: Negative for blurred vision and double vision.  Respiratory: Negative for cough, shortness of breath and wheezing.   Cardiovascular: Negative for chest pain, palpitations and leg swelling.  Gastrointestinal: Positive for abdominal pain. Negative for constipation, diarrhea, nausea and vomiting.       Hiccups  Genitourinary: Negative for dysuria.  Musculoskeletal: Negative for myalgias.  Neurological: Negative for dizziness, speech change, focal weakness, seizures and headaches.  Psychiatric/Behavioral: Negative for depression.    DRUG ALLERGIES:  No Known Allergies  VITALS:  Blood pressure 129/63, pulse (!) 103, temperature 98.5 F (36.9 C), temperature source Oral, resp. rate 18, height 5\' 7"  (1.702 m), weight 74.8 kg (165 lb), SpO2 94 %.  PHYSICAL EXAMINATION:  Physical Exam  GENERAL:  83 y.o.-year-old patient lying in the bed with no acute distress.  EYES: Pupils equal, round, reactive to light and accommodation. No scleral icterus. Extraocular muscles intact.  HEENT: Head atraumatic, normocephalic. Oropharynx and nasopharynx clear.  NECK:  Supple, no jugular venous distention. No thyroid enlargement, no tenderness.  LUNGS: Normal breath sounds bilaterally, no wheezing, rales,rhonchi or crepitation. No use of accessory muscles of respiration.  CARDIOVASCULAR: S1, S2 normal. No  murmurs, rubs, or gallops.  ABDOMEN: Soft, nontender,Discomfort on palpating the lower abdomen, nondistended. Bowel sounds present. No organomegaly or mass.  EXTREMITIES: No pedal edema, cyanosis, or clubbing.  NEUROLOGIC: Cranial nerves II through XII are intact. Muscle strength 5/5 in all extremities. Sensation intact. Gait not checked.  PSYCHIATRIC: The patient is alert and oriented x 3. No active depression SKIN: No obvious rash, lesion, or ulcer.    LABORATORY PANEL:   CBC  Recent Labs Lab 09/16/16 0731  WBC 91.5*  HGB 9.6*  HCT 29.6*  PLT 367   ------------------------------------------------------------------------------------------------------------------  Chemistries   Recent Labs Lab 09/15/16 1815 09/16/16 0731  NA 135 134*  K 4.3 3.9  CL 99* 106  CO2 27 24  GLUCOSE 113* 89  BUN 38* 30*  CREATININE 2.08* 1.69*  CALCIUM 12.8* 11.4*  AST 36  --   ALT 30  --   ALKPHOS 156*  --   BILITOT 0.8  --    ------------------------------------------------------------------------------------------------------------------  Cardiac Enzymes  Recent Labs Lab 09/16/16 0731  TROPONINI 0.11*   ------------------------------------------------------------------------------------------------------------------  RADIOLOGY:  Ct Abdomen Pelvis Wo Contrast  Result Date: 09/15/2016 CLINICAL DATA:  Metastatic renal cell carcinoma. Generalized abdominal pain. EXAM: CT CHEST, ABDOMEN AND PELVIS WITHOUT CONTRAST TECHNIQUE: Multidetector CT imaging of the chest, abdomen and pelvis was performed following the standard protocol without IV contrast. COMPARISON:  CT from 08/27/2016 FINDINGS: CT CHEST FINDINGS Cardiovascular: The heart size is normal. No pericardial effusion. Coronary stent noted. Atherosclerotic calcification is noted in the wall of the thoracic aorta. Mediastinum/Nodes: No mediastinal lymphadenopathy. No evidence for gross hilar lymphadenopathy although assessment is  limited by the lack of intravenous contrast on today's study. The esophagus has normal imaging features. There is no axillary lymphadenopathy.  Lungs/Pleura: Multiple bilateral pulmonary nodules are identified. One of the dominant right-sided nodules is identified anterior right lower lobe (image 99 series 4) and measures 17 mm. Dominant left-sided nodule is identified in the upper lobe and measures 18 mm. Other scattered bilateral pulmonary nodules range in size from 3-4 mm up to about 16 mm. Musculoskeletal: Bone windows reveal no worrisome lytic or sclerotic osseous lesions. CT ABDOMEN PELVIS FINDINGS Hepatobiliary: Multiple liver lesions are evident, but difficult to assess given the lack of intravenous contrast. 2.8 cm lesion identified anterior liver on image 55. 2.2 cm lesion identified inferior right liver image 68. Other liver lesions are seen scattered in both hepatic lobes. Calcified gallstones evident. No intrahepatic or extrahepatic biliary dilation. Pancreas: No focal mass lesion. No dilatation of the main duct. No intraparenchymal cyst. No peripancreatic edema. Spleen: No splenomegaly. No focal mass lesion. Adrenals/Urinary Tract: Right adrenal gland unremarkable. Left adrenal lesion measures 4.5 x 3.4 cm today compared to 3.1 x 2.6 cm previously. Left upper pole renal mass difficult to measure given lack of intravenous contrast. It is probably about 6.4 x 6.0 cm today compared to 5.2 x 5.8 cm previously. Right kidney unremarkable. No hydroureter. The urinary bladder appears normal for the degree of distention. Stomach/Bowel: Stomach is nondistended. No gastric wall thickening. No evidence of outlet obstruction. Duodenum is normally positioned as is the ligament of Treitz. No small bowel wall thickening. No small bowel dilatation. The terminal ileum is normal. The appendix is not visualized, but there is no edema or inflammation in the region of the cecum. Diverticular changes are noted in the left  colon without evidence of diverticulitis. Vascular/Lymphatic: There is abdominal aortic atherosclerosis without aneurysm. Retroperitoneal lymphadenopathy is progressed. 15 mm left para-aortic lymph node measured previously is now 23 mm. 12 mm short axis lymph node measured previously is now 13 mm. 13 mm left para-aortic lymph node seen on image 68 series 2 was 9 mm when remeasured on prior study. Reproductive: Prostate gland is enlarged. Other: No intraperitoneal free fluid. Musculoskeletal: Bone windows reveal no worrisome lytic or sclerotic osseous lesions. IMPRESSION: 1. Multiple bilateral pulmonary nodules consistent with metastatic disease. 2. Interval progression of the left upper pole renal mass previously characterized on contrast infused CT and MRI. 3. Interval progression of metastatic disease in the liver and left para-aortic space. 4. Left colonic diverticulosis without diverticulitis. 5.  Abdominal Aortic Atherosclerois (ICD10-170.0) Electronically Signed   By: Misty Stanley M.D.   On: 09/15/2016 20:34   Ct Head Wo Contrast  Result Date: 09/15/2016 CLINICAL DATA:  83 year old male with confusion and altered mental status. History of metastatic renal cell carcinoma. EXAM: CT HEAD WITHOUT CONTRAST TECHNIQUE: Contiguous axial images were obtained from the base of the skull through the vertex without intravenous contrast. COMPARISON:  None. FINDINGS: Brain: Small right posterior parietal infarct is age indeterminate but probably remote. Mild generalized cerebral volume loss and mild probable chronic small-vessel white matter ischemic changes noted. There is no evidence of hemorrhage, midline shift, hydrocephalus or extra-axial collection. Vascular:  Mild intracranial atherosclerotic calcifications noted. Skull: Normal. Negative for fracture or focal lesion. Sinuses/Orbits: No acute finding. Other: None. 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 No evidence of  hemorrhage. Mild atrophy and chronic small-vessel white matter ischemic changes. Electronically Signed   By: Margarette Canada M.D.   On: 09/15/2016 20:14   Ct Chest Wo Contrast  Result Date: 09/15/2016 CLINICAL DATA:  Metastatic renal cell carcinoma. Generalized abdominal pain. EXAM: CT  CHEST, ABDOMEN AND PELVIS WITHOUT CONTRAST TECHNIQUE: Multidetector CT imaging of the chest, abdomen and pelvis was performed following the standard protocol without IV contrast. COMPARISON:  CT from 08/27/2016 FINDINGS: CT CHEST FINDINGS Cardiovascular: The heart size is normal. No pericardial effusion. Coronary stent noted. Atherosclerotic calcification is noted in the wall of the thoracic aorta. Mediastinum/Nodes: No mediastinal lymphadenopathy. No evidence for gross hilar lymphadenopathy although assessment is limited by the lack of intravenous contrast on today's study. The esophagus has normal imaging features. There is no axillary lymphadenopathy. Lungs/Pleura: Multiple bilateral pulmonary nodules are identified. One of the dominant right-sided nodules is identified anterior right lower lobe (image 99 series 4) and measures 17 mm. Dominant left-sided nodule is identified in the upper lobe and measures 18 mm. Other scattered bilateral pulmonary nodules range in size from 3-4 mm up to about 16 mm. Musculoskeletal: Bone windows reveal no worrisome lytic or sclerotic osseous lesions. CT ABDOMEN PELVIS FINDINGS Hepatobiliary: Multiple liver lesions are evident, but difficult to assess given the lack of intravenous contrast. 2.8 cm lesion identified anterior liver on image 55. 2.2 cm lesion identified inferior right liver image 68. Other liver lesions are seen scattered in both hepatic lobes. Calcified gallstones evident. No intrahepatic or extrahepatic biliary dilation. Pancreas: No focal mass lesion. No dilatation of the main duct. No intraparenchymal cyst. No peripancreatic edema. Spleen: No splenomegaly. No focal mass lesion.  Adrenals/Urinary Tract: Right adrenal gland unremarkable. Left adrenal lesion measures 4.5 x 3.4 cm today compared to 3.1 x 2.6 cm previously. Left upper pole renal mass difficult to measure given lack of intravenous contrast. It is probably about 6.4 x 6.0 cm today compared to 5.2 x 5.8 cm previously. Right kidney unremarkable. No hydroureter. The urinary bladder appears normal for the degree of distention. Stomach/Bowel: Stomach is nondistended. No gastric wall thickening. No evidence of outlet obstruction. Duodenum is normally positioned as is the ligament of Treitz. No small bowel wall thickening. No small bowel dilatation. The terminal ileum is normal. The appendix is not visualized, but there is no edema or inflammation in the region of the cecum. Diverticular changes are noted in the left colon without evidence of diverticulitis. Vascular/Lymphatic: There is abdominal aortic atherosclerosis without aneurysm. Retroperitoneal lymphadenopathy is progressed. 15 mm left para-aortic lymph node measured previously is now 23 mm. 12 mm short axis lymph node measured previously is now 13 mm. 13 mm left para-aortic lymph node seen on image 68 series 2 was 9 mm when remeasured on prior study. Reproductive: Prostate gland is enlarged. Other: No intraperitoneal free fluid. Musculoskeletal: Bone windows reveal no worrisome lytic or sclerotic osseous lesions. IMPRESSION: 1. Multiple bilateral pulmonary nodules consistent with metastatic disease. 2. Interval progression of the left upper pole renal mass previously characterized on contrast infused CT and MRI. 3. Interval progression of metastatic disease in the liver and left para-aortic space. 4. Left colonic diverticulosis without diverticulitis. 5.  Abdominal Aortic Atherosclerois (ICD10-170.0) Electronically Signed   By: Misty Stanley M.D.   On: 09/15/2016 20:34    EKG:   Orders placed or performed in visit on 09/15/16  . EKG 12-Lead    ASSESSMENT AND PLAN:    83 year old male with past medical history significant for skin cancer, arthritis who was recently diagnosed with metastatic urothelial carcinoma 3 days ago presents to hospital secondary to altered mental status and hypercalcemia.  #1 hypercalcemia-secondary to metastatic tumor, tumor lysis. -Received Zometa yesterday. Calcium is improving. But fluids have been discontinued as plans for no further  treatment according to patient's wishes was completely alert and oriented. Case has been discussed with oncology as well. Plan is to set up hospice services at home. Wife is aware. -No further blood draws have been ordered  #2 leukocytosis-reactive leukocytosis with neutrophilia secondary to his underlying malignancy. No lymphoma or leukemia per oncology. -Blood cultures were drawn. But no plans for any antibiotics.  #3 acute renal failure-likely secondary to upper urothelial carcinoma  #4 Metastatic upper urothelial carcinoma- has large left renal mass, liver mets, lung mets - Oncology consult appreciated- all options presented to patient and he understands and opts for no further tests and treatment and hospice care - hospice eval, palliative care consult and plans for discharge home with hospice tomorrow - thorazine for hiccups ordered - no further labs  #5 Depression with suicidal ideation- on involuntary commitment - psych consult pending, sitter at bedside - patient understands the nature of his aggressive tumor and was suicidal- now anyways going with hospice - no further treatment - monitor  #6 DVT Prophylaxis- discontinue SQ heparin as patient on comfort measures    All the records are reviewed and case discussed with Care Management/Social Workerr. Management plans discussed with the patient, family and they are in agreement.  CODE STATUS: DO NOT RESUSCITATE  TOTAL TIME TAKING CARE OF THIS PATIENT: 38 minutes.   POSSIBLE D/C TOMORROW, DEPENDING ON CLINICAL  CONDITION.   Gladstone Lighter M.D on 09/16/2016 at 11:38 AM  Between 7am to 6pm - Pager - [phone]  After 6pm go to [email or web address] - password [email or web address] [facility]  Office  [phone]  CC: Primary care physician; Juluis Pitch, MD

## 2016-09-16 NOTE — Consult Note (Addendum)
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 Metastatic urothelial carcinoma ([place]) [C79.10] 09/15/2016  . CAD (coronary artery disease) [I25.10] 09/15/2016  . Leukocytosis [D72.829] 09/15/2016  . Suicidal ideation [R45.851] 09/15/2016  . AKI (acute kidney injury) ([place]) [N17.9] 09/15/2016  . Tumor lysis syndrome [E88.3] 09/15/2016  . Special screening for malignant neoplasms, colon [Z12.11]   . Abdominal pain, epigastric [R10.13]   . Esophagitis, unspecified [K20.9]   . Gastritis [K29.70]   . Blood in the urine [R31.9] 02/22/2015  . S/P drug eluting coronary stent placement [Z95.5] 12/31/2014  . Presence of coronary angioplasty implant and graft [Z95.5] 12/31/2014  . Acute non Q wave MI (myocardial infarction), initial episode of care ([place]) [I21.4] 12/29/2014  . Myocardial infarction [I21.9] 12/29/2014    Total Time spent with patient: 1 hour  Subjective:   DALEON WILLINGER is a 83 y.o. male patient admitted with suicidal attempt.  HPI:  Patient is an 83 year old married Caucasian male who came to our emergency department by EMS on March 10. Per EMS report, patient was found on the back patio lying on his back with a blanket over him. Neighbors saw him on the patio lying down. When EMS took off the blanket, patient had a loaded pistol in his lap that had jammed. Patient verified to EMS that he tried to kill himself by shooting himself in the head because he is "sick."   Patient was very recently diagnosed with metastatic urothelial carcinoma.  Patient was admitted to the medical floor due to electrolyte imbalances.   At this time he has been evaluated by  the oncologist. The patient has decided not to continue with treatment. Arrangements have been made for the patient to have hospice services at home. His wife has been made aware. They are not currently any orders for other treatments or labs.  Patient tells me he lived a good life and he is ready to go. I spoke with wife today,she is also in agreement with plans for hospice.  "We had a good life together". She tells me [place] 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  They have been together for >50 years.  No prior psych history. Friend from [facility] are coming over to help them.      Past Psychiatric History: none  Risk to Self: Suicidal Ideation: Yes-Currently Present Suicidal Intent: Yes-Currently Present Is patient at risk for suicide?: Yes Suicidal Plan?: Yes-Currently Present Specify Current Suicidal Plan: Pt attempted to shoot self pta Access to Means: Yes Specify Access to Suicidal Means: access to firearm What has been your use of drugs/alcohol within the last 12 months?: Pt denies substance use Other Self Harm Risks: Medical Illness Intentional Self Injurious Behavior: None Risk to Others: Homicidal Ideation: No Thoughts of Harm to Others: No Current Homicidal Intent: No Current Homicidal Plan: No Access to Homicidal Means: No History of harm to others?: No Assessment of Violence: None Noted Does patient have access to weapons?: No Criminal Charges Pending?: No Does patient have a court date: No Prior Inpatient Therapy: Prior Inpatient Therapy: No Prior Outpatient Therapy: Prior Outpatient Therapy: No Does patient have an ACCT team?: No Does patient have Intensive In-House Services?  :  No Does patient have [facility] services? : No Does patient have P4[facility] services?: No  Past Medical History:  Past Medical History:  Diagnosis Date  . AKI (acute kidney injury) ([place]) 09/15/2016  . Arthritis    hips  . Cataract   . History of kidney stones    . Metastatic urothelial carcinoma ([place]) 09/15/2016  . NSTEMI (non-ST elevated myocardial infarction) ([place])   . Skin cancer     Past Surgical History:  Procedure Laterality Date  . APPENDECTOMY  1950's  . CARDIAC CATHETERIZATION N/A 12/30/2014   Procedure: Left Heart Cath;  Surgeon: Isaias Cowman, MD;  Location: [facility];  Service: Cardiovascular;  Laterality: N/A;  . COLONOSCOPY WITH PROPOFOL N/A 08/25/2015   Procedure: COLONOSCOPY WITH PROPOFOL;  Surgeon: Lucilla Lame, MD;  Location: [facility];  Service: Endoscopy;  Laterality: N/A;  . ESOPHAGOGASTRODUODENOSCOPY (EGD) WITH PROPOFOL N/A 08/25/2015   Procedure: ESOPHAGOGASTRODUODENOSCOPY (EGD) WITH PROPOFOL;  Surgeon: Lucilla Lame, MD;  Location: [facility];  Service: Endoscopy;  Laterality: N/A;   Family History:  Family History  Problem Relation Age of Onset  . Heart attack Maternal Uncle   . Alcohol abuse Neg Hx   . Arthritis Neg Hx   . Asthma Neg Hx   . Birth defects Neg Hx   . Cancer Neg Hx   . COPD Neg Hx   . Depression Neg Hx   . Drug abuse Neg Hx   . Diabetes Neg Hx   . Early death Neg Hx   . Hearing loss Neg Hx   . Heart disease Neg Hx   . Hyperlipidemia Neg Hx   . Hypertension Neg Hx   . Kidney disease Neg Hx   . Learning disabilities Neg Hx   . Mental illness Neg Hx   . Mental retardation Neg Hx   . Miscarriages / Stillbirths Neg Hx   . Stroke Neg Hx   . Vision loss Neg Hx   . Varicose Veins Neg Hx    Family Psychiatric  History: Patient has only died of complications with alcoholism  Social History: Lives at home with his wife History  Alcohol Use  . 3.6 oz/week  . 6 Cans of beer per week    Comment: has not been drinking alcohol last few weeks     History  Drug Use No    Social History   Social History  . Marital status: Married    Spouse name: N/A  . Number of children: N/A  . Years of education: N/A   Social History Main Topics  . Smoking status: Never Smoker   . Smokeless tobacco: Never Used  . Alcohol use 3.6 oz/week    6 Cans of beer per week     Comment: has not been drinking alcohol last few weeks  . Drug use: No  . Sexual activity: Not Asked   Other Topics Concern  . None   Social History Narrative  . None   Additional Social History:    Allergies:  No Known Allergies  Labs:  Results for orders placed or performed during the hospital encounter of 09/15/16 (from the past 48 hour(s))  CBC with Differential/Platelet     Status: Abnormal   Collection Time: 09/15/16  6:15 PM  Result Value Ref Range   WBC 108.1 (HH) 3.8 - 10.6 K/uL    Comment: REPEATED TO VERIFY CRITICAL RESULT CALLED TO, READ BACK BY AND VERIFIED WITH: SUSAN NEAL 09/15/16 AT 1845 BY HS  RBC 4.37 (L) 4.40 - 5.90 MIL/uL   Hemoglobin 11.3 (L) 13.0 - 18.0 g/dL   HCT 34.7 (L) 40.0 - 52.0 %   MCV 79.6 (L) 80.0 - 100.0 fL   MCH 25.9 (L) 26.0 - 34.0 pg   MCHC 32.5 32.0 - 36.0 g/dL   RDW 15.9 (H) 11.5 - 14.5 %   Platelets 411 150 - 440 K/uL   Neutrophils Relative % 95 %   Lymphocytes Relative 1 %   Monocytes Relative 3 %   Eosinophils Relative 0 %   Basophils Relative 1 %   Neutro Abs 102.7 (H) 1.4 - 6.5 K/uL   Lymphs Abs 1.1 1.0 - 3.6 K/uL   Monocytes Absolute 3.2 (H) 0.2 - 1.0 K/uL   Eosinophils Absolute 0.0 0 - 0.7 K/uL   Basophils Absolute 1.1 (H) 0 - 0.1 K/uL  Comprehensive metabolic panel     Status: Abnormal   Collection Time: 09/15/16  6:15 PM  Result Value Ref Range   Sodium 135 135 - 145 mmol/L   Potassium 4.3 3.5 - 5.1 mmol/L   Chloride 99 (L) 101 - 111 mmol/L   CO2 27 22 - 32 mmol/L   Glucose, Bld 113 (H) 65 - 99 mg/dL   BUN 38 (H) 6 - 20 mg/dL   Creatinine, Ser 2.08 (H) 0.61 - 1.24 mg/dL   Calcium 12.8 (H) 8.9 - 10.3 mg/dL   Total Protein 6.7 6.5 - 8.1 g/dL   Albumin 2.8 (L) 3.5 - 5.0 g/dL   AST 36 15 - 41 U/L   ALT 30 17 - 63 U/L   Alkaline Phosphatase 156 (H) 38 - 126 U/L   Total Bilirubin 0.8 0.3 - 1.2 mg/dL   GFR calc non Af Amer 28  (L) >60 mL/min   GFR calc Af Amer 33 (L) >60 mL/min    Comment: (NOTE) The eGFR has been calculated using the CKD EPI equation. This calculation has not been validated in all clinical situations. eGFR's persistently <60 mL/min signify possible Chronic Kidney Disease.    Anion gap 9 5 - 15  Acetaminophen level     Status: Abnormal   Collection Time: 09/15/16  6:15 PM  Result Value Ref Range   Acetaminophen (Tylenol), Serum <10 (L) 10 - 30 ug/mL    Comment:        THERAPEUTIC CONCENTRATIONS VARY SIGNIFICANTLY. A RANGE OF 10-30 ug/mL MAY BE AN EFFECTIVE CONCENTRATION FOR MANY PATIENTS. HOWEVER, SOME ARE BEST TREATED AT CONCENTRATIONS OUTSIDE THIS RANGE. ACETAMINOPHEN CONCENTRATIONS >150 ug/mL AT 4 HOURS AFTER INGESTION AND >50 ug/mL AT 12 HOURS AFTER INGESTION ARE OFTEN ASSOCIATED WITH TOXIC REACTIONS.   Salicylate level     Status: None   Collection Time: 09/15/16  6:15 PM  Result Value Ref Range   Salicylate Lvl <1.7 2.8 - 30.0 mg/dL  Troponin I     Status: Abnormal   Collection Time: 09/15/16  6:15 PM  Result Value Ref Range   Troponin I 0.11 (HH) <0.03 ng/mL    Comment: CRITICAL RESULT CALLED TO, READ BACK BY AND VERIFIED WITH DAVID WALKER AT 1857 ON 09/15/2016 JLJ   Uric acid     Status: Abnormal   Collection Time: 09/15/16  6:15 PM  Result Value Ref Range   Uric Acid, Serum 8.4 (H) 4.4 - 7.6 mg/dL  Phosphorus     Status: None   Collection Time: 09/15/16  6:15 PM  Result Value Ref Range   Phosphorus 2.5 2.5 - 4.6 mg/dL  Rasburicase - Uric Acid (special collection / handling)     Status: Abnormal   Collection Time: 09/15/16  6:15 PM  Result Value Ref Range   Uric Acid, Serum 8.6 (H) 4.4 - 7.6 mg/dL  Urine Drug Screen, Qualitative ([facility] only)     Status: Abnormal   Collection Time: 09/15/16 11:06 PM  Result Value Ref Range   Tricyclic, Ur Screen NONE DETECTED NONE DETECTED   Amphetamines, Ur Screen NONE DETECTED NONE DETECTED   MDMA (Ecstasy)Ur Screen NONE  DETECTED NONE DETECTED   Cocaine Metabolite,Ur [place] NONE DETECTED NONE DETECTED   Opiate, Ur Screen POSITIVE (A) NONE DETECTED   Phencyclidine (PCP) Ur S NONE DETECTED NONE DETECTED   Cannabinoid 50 Ng, Ur [employer] NONE DETECTED NONE DETECTED   Barbiturates, Ur Screen NONE DETECTED NONE DETECTED   Benzodiazepine, Ur Scrn NONE DETECTED NONE DETECTED   Methadone Scn, Ur NONE DETECTED NONE DETECTED    Comment: (NOTE) 409  Tricyclics, urine               Cutoff 1000 ng/mL 200  Amphetamines, urine             Cutoff 1000 ng/mL 300  MDMA (Ecstasy), urine           Cutoff 500 ng/mL 400  Cocaine Metabolite, urine       Cutoff 300 ng/mL 500  Opiate, urine                   Cutoff 300 ng/mL 600  Phencyclidine (PCP), urine      Cutoff 25 ng/mL 700  Cannabinoid, urine              Cutoff 50 ng/mL 800  Barbiturates, urine             Cutoff 200 ng/mL 900  Benzodiazepine, urine           Cutoff 200 ng/mL 1000 Methadone, urine                Cutoff 300 ng/mL 1100 1200 The urine drug screen provides only a preliminary, unconfirmed 1300 analytical test result and should not be used for non-medical 1400 purposes. Clinical consideration and professional judgment should 1500 be applied to any positive drug screen result due to possible 1600 interfering substances. A more specific alternate chemical method 1700 must be used in order to obtain a confirmed analytical result.  1800 Gas chromato graphy / mass spectrometry (GC/MS) is the preferred 1900 confirmatory method.   Troponin I     Status: Abnormal   Collection Time: 09/16/16  1:27 AM  Result Value Ref Range   Troponin I 0.11 (HH) <0.03 ng/mL    Comment: CRITICAL VALUE NOTED. VALUE IS CONSISTENT WITH PREVIOUSLY REPORTED/CALLED VALUE.PMH  Troponin I     Status: Abnormal   Collection Time: 09/16/16  7:31 AM  Result Value Ref Range   Troponin I 0.11 (HH) <0.03 ng/mL    Comment: CRITICAL VALUE NOTED. VALUE IS CONSISTENT WITH PREVIOUSLY REPORTED/CALLED  VALUE [facility]   Uric acid     Status: None   Collection Time: 09/16/16  7:31 AM  Result Value Ref Range   Uric Acid, Serum 6.4 4.4 - 7.6 mg/dL  CBC     Status: Abnormal   Collection Time: 09/16/16  7:31 AM  Result Value Ref Range   WBC 91.5 (HH) 3.8 - 10.6 K/uL    Comment: CRITICAL VALUE NOTED.  VALUE IS CONSISTENT WITH PREVIOUSLY REPORTED AND CALLED VALUE.  RBC 3.72 (L) 4.40 - 5.90 MIL/uL   Hemoglobin 9.6 (L) 13.0 - 18.0 g/dL   HCT 29.6 (L) 40.0 - 52.0 %   MCV 79.6 (L) 80.0 - 100.0 fL   MCH 25.8 (L) 26.0 - 34.0 pg   MCHC 32.5 32.0 - 36.0 g/dL   RDW 16.0 (H) 11.5 - 14.5 %   Platelets 367 150 - 440 K/uL  Basic metabolic panel     Status: Abnormal   Collection Time: 09/16/16  7:31 AM  Result Value Ref Range   Sodium 134 (L) 135 - 145 mmol/L   Potassium 3.9 3.5 - 5.1 mmol/L   Chloride 106 101 - 111 mmol/L   CO2 24 22 - 32 mmol/L   Glucose, Bld 89 65 - 99 mg/dL   BUN 30 (H) 6 - 20 mg/dL   Creatinine, Ser 1.69 (H) 0.61 - 1.24 mg/dL   Calcium 11.4 (H) 8.9 - 10.3 mg/dL   GFR calc non Af Amer 37 (L) >60 mL/min   GFR calc Af Amer 42 (L) >60 mL/min    Comment: (NOTE) The eGFR has been calculated using the CKD EPI equation. This calculation has not been validated in all clinical situations. eGFR's persistently <60 mL/min signify possible Chronic Kidney Disease.    Anion gap 4 (L) 5 - 15    Current Facility-Administered Medications  Medication Dose Route Frequency Provider Last Rate Last Dose  . acetaminophen (TYLENOL) tablet 650 mg  650 mg Oral Q6H PRN Lance Coon, MD       Or  . acetaminophen (TYLENOL) suppository 650 mg  650 mg Rectal Q6H PRN Lance Coon, MD      . aspirin chewable tablet 81 mg  81 mg Oral Daily Lance Coon, MD      . chlorproMAZINE (THORAZINE) tablet 25 mg  25 mg Oral TID Gladstone Lighter, MD      . feeding supplement (ENSURE ENLIVE) (ENSURE ENLIVE) liquid 237 mL  237 mL Oral TID WC Gladstone Lighter, MD   237 mL at 09/16/16 1200  . morphine 2 MG/ML  injection 2 mg  2 mg Intravenous Q4H PRN Lance Coon, MD      . ondansetron [facility]) tablet 4 mg  4 mg Oral Q6H PRN Lance Coon, MD       Or  . ondansetron [facility]) injection 4 mg  4 mg Intravenous Q6H PRN Lance Coon, MD      . oxyCODONE (Oxy IR/ROXICODONE) immediate release tablet 5 mg  5 mg Oral Q4H PRN Lance Coon, MD      . sodium chloride flush (NS) 0.9 % injection 3 mL  3 mL Intravenous Q12H Lance Coon, MD   3 mL at 09/16/16 0345    Musculoskeletal: Strength & Muscle Tone: within normal limits Gait & Station: normal Patient leans: N/A  Psychiatric Specialty Exam: Physical Exam  Constitutional: He is oriented to person, place, and time. He appears well-developed and well-nourished.  HENT:  Head: Normocephalic and atraumatic.  Eyes: EOM are normal.  Respiratory: Effort normal.  Musculoskeletal: Normal range of motion.  Neurological: He is alert and oriented to person, place, and time.    Review of Systems  Constitutional: Negative.   HENT: Negative.   Eyes: Negative.   Respiratory: Negative.   Cardiovascular: Negative.   Gastrointestinal: Negative.   Genitourinary: Negative.   Musculoskeletal: Negative.   Skin: Negative.   Neurological: Negative.   Endo/Heme/Allergies: Negative.   Psychiatric/Behavioral: Positive for depression and suicidal ideas.    Blood pressure Marland Kitchen)  104/46, pulse (!) 106, temperature 98.5 F (36.9 C), temperature source Oral, resp. rate 18, height 5' 7"  (1.702 m), weight 74.8 kg (165 lb), SpO2 95 %.Body mass index is 25.84 kg/m.  General Appearance: Fairly Groomed  Eye Contact:  Fair  Speech:  Slow  Volume:  Decreased  Mood:  Dysphoric  Affect:  Blunt  Thought Process:  Linear and Descriptions of Associations: Intact  Orientation:  Full (Time, Place, and Person)  Thought Content:  Hallucinations: None  Suicidal Thoughts:  Yes.  without intent/plan  Homicidal Thoughts:  No  Memory:  Immediate;   Fair Recent;   Fair Remote;   Fair   Judgement:  Fair  Insight:  Fair  Psychomotor Activity:  Decreased  Concentration:  Concentration: Fair and Attention Span: Fair  Recall:  [employer] of Knowledge:  Fair  Language:  Fair  Akathisia:  No  Handed:    AIMS (if indicated):     Assets:  Social Support  ADL's:  Impaired  Cognition:  Impaired,  Mild  Sleep:        Treatment Plan Summary:  Patient is a 83 year old married Caucasian male with depressive disorder secondary to metastatic carcinoma.  Patient arrived to the emergency department after a suicidal attempt. He was eventually admitted to the medical floor due to electrolyte imbalances and acute renal failure.  Patient has been evaluated by hospitalization oncologist. This time the patient has decided not to proceed with any treatment for his cancer.  Patient appears to have capacity at this time to make those decisions. Palliative care consult has been done. Arrangements are in place for the patient to start receiving hospice services at home. The plans are for him to be discharged from the hospital tomorrow  I agree with the plan in place. Psychiatry does not have any further recommendations at this time.     Disposition: Pt will be d/c home with hospice services  Hildred Priest, MD 09/16/2016 3:23 PM

## 2016-09-16 NOTE — Progress Notes (Signed)
Assumed care of patient from Juliann Pulse, RN at [phone]. Madlyn Frankel, RN

## 2016-09-16 NOTE — Consult Note (Signed)
Hematology/Oncology Consult note [facility] [phone] [phone]  Patient Care Team: Juluis Pitch, MD as PCP - General (Family Medicine) Yolonda Kida, MD as Consulting Physician (Cardiology)   Name of the patient: Michael Brennan  [id number]  Jan 22, 1934    Reason for consultation: newly diagnosed urothelial carcinoma   Requesting physician- Dr. Tressia Miners  Date of visit: 09/16/2016    History of presenting illness- 1. Patient is a 83 year old male with a past medical history significant for hypertension and hyperlipidemia. Patient was doing well up until December when he started developing worsening left-sided abdominal pain. He was treated presumptively with antibiotics for possible colitis but the pain did not get better. Patient has also been having low-grade fevers along with drenching night sweats and fatigue  2. This was followed by a CT abdomen and pelvis on 08/27/2016 which showed 5.8 cm ill-defined heterogeneous mass in the upper and mid pole of the left kidney. This is suspicious for malignancy. 3.1 cm indeterminate left adrenal mass and small indeterminate low-attenuation liver lesions area and recommend MRI with and without contrast for further evaluation. Mild left abdominal retroperitoneal and celiac axis adenopathy  3. MRI of the abdomen on 08/30/2016 showed: 5.4 x 5.9 x 5.3 cm left renal mass involving the middle/upper pole with extension into the proximal aspect of the left renal vein (not extending to the IVC), left adrenal metastasis, multiple liver metastases and left periaortic metastatic lymphadenopathy. 2. 6 mm tiny cystic appearing lesion in the uncinate process of the pancreas. Repeat MRI of the abdomen with and without IV gadolinium with MRCP is suggested in 2 years to ensure stability of this Lesion.  4.  Also recent CBC from 08/29/2016 showed white count of 41.2 with an H&H of 11/33.1, MCV 84.7 and a  platelet count of 560. Patient has had a normal white count in June and February 2017 but his white count on 2 occasions in February 2018 was 23.3 followed by 28.6. Patient has had an elevated platelet count between 490-554 in 2017. Differential showed 89.8% neutrophils with relative lymphopenia  5. Prior to the acute events patient has been very active and does not have significant comorbidities  6. Patient underwent US guided liver biopsy which showed:   SPECIMEN SUBMITTED:  A. Liver, right lobe; ultrasound-guided biopsy   CLINICAL HISTORY:  6 cm left renal mass and multiple liver lesions   PRE-OPERATIVE DIAGNOSIS:  2 cm lesion right lobe of liver, metastatic renal carcinoma   POST-OPERATIVE DIAGNOSIS:  None provided.      DIAGNOSIS:  A. LIVER, RIGHT LOBE; ULTRASOUND-GUIDED CORE BIOPSY:  - METASTATIC CARCINOMA, FAVOR KIDNEY/RENAL PELVIS ORIGIN.   Comment:  This poorly differentiated metastatic neoplasm is composed of  pleomorphic giant cells without tubular/glandular differentiation. The  mitotic rate is high. There is a prominent neutrophilic infiltrate  associated with the tumor. Immunohistochemistry (IHC) was performed for  further characterization, with the following results:  Positive: Cytokeratin (AE1/AE3/Cam5.2), p40, PAX8, and CK7  Negative: GATA3, TTF-1   The giant cell features are unusual for metastatic carcinoma of any  type, but upper tract urothelial carcinomas frequently show variant  morphology. The diffuse strong staining for p40 and CK7 is an expected  pattern for urothelial carcinoma. PAX8 is considered a marker of renal  tubular origin, but a significant proportion of upper tract urothelial  carcinomas are also PAX8 positive. Sarcomatoid urothelial carcinomas are  very often GATA3 negative, so the negative result in this case should  not weigh  against urothelial origin. On balance, the IHC results favor  upper tract urothelial origin, but aberrant  staining of another tumor  type is not entirely ruled out.   7. Patient now admitted for encephalopathy, hypercalcemia and WBC count of 108 on admission  8. Currently patient is AAO X3. His wife is at his bedside. Patients wife tells me he has been feeling worse over last 2-3 days and has told her repeatedly that he does not want any more tests or treatment at home as well  ECOG PS- 3  Pain scale- 3   Review of systems- Review of Systems  Constitutional: Positive for chills, malaise/fatigue and weight loss.       Poor appetite  Gastrointestinal: Positive for abdominal pain.  Neurological: Positive for weakness.    No Known Allergies  Patient Active Problem List   Diagnosis Date Noted  . Metastatic urothelial carcinoma (HCC) 09/15/2016  . CAD (coronary artery disease) 09/15/2016  . Leukocytosis 09/15/2016  . Suicidal ideation 09/15/2016  . AKI (acute kidney injury) (HCC) 09/15/2016  . Tumor lysis syndrome 09/15/2016  . Special screening for malignant neoplasms, colon   . Abdominal pain, epigastric   . Esophagitis, unspecified   . Gastritis   . Blood in the urine 02/22/2015  . S/P drug eluting coronary stent placement 12/31/2014  . Presence of coronary angioplasty implant and graft 12/31/2014  . Acute non Q wave MI (myocardial infarction), initial episode of care (HCC) 12/29/2014  . Myocardial infarction 12/29/2014     Past Medical History:  Diagnosis Date  . AKI (acute kidney injury) (HCC) 09/15/2016  . Arthritis    hips  . Cataract   . History of kidney stones   . Metastatic urothelial carcinoma (HCC) 09/15/2016  . NSTEMI (non-ST elevated myocardial infarction) (HCC)   . Skin cancer      Past Surgical History:  Procedure Laterality Date  . APPENDECTOMY  1950's  . CARDIAC CATHETERIZATION N/A 12/30/2014   Procedure: Left Heart Cath;  Surgeon: Marcina Millard, MD;  Location: [facility] INVASIVE CV [facility];  Service: Cardiovascular;  Laterality: N/A;  . COLONOSCOPY WITH  PROPOFOL N/A 08/25/2015   Procedure: COLONOSCOPY WITH PROPOFOL;  Surgeon: Midge Minium, MD;  Location: [facility];  Service: Endoscopy;  Laterality: N/A;  . ESOPHAGOGASTRODUODENOSCOPY (EGD) WITH PROPOFOL N/A 08/25/2015   Procedure: ESOPHAGOGASTRODUODENOSCOPY (EGD) WITH PROPOFOL;  Surgeon: Midge Minium, MD;  Location: [facility];  Service: Endoscopy;  Laterality: N/A;    Social History   Social History  . Marital status: Married    Spouse name: N/A  . Number of children: N/A  . Years of education: N/A   Occupational History  . Not on file.   Social History Main Topics  . Smoking status: Never Smoker  . Smokeless tobacco: Never Used  . Alcohol use 3.6 oz/week    6 Cans of beer per week     Comment: has not been drinking alcohol last few weeks  . Drug use: No  . Sexual activity: Not on file   Other Topics Concern  . Not on file   Social History Narrative  . No narrative on file     Family History  Problem Relation Age of Onset  . Heart attack Maternal Uncle   . Alcohol abuse Neg Hx   . Arthritis Neg Hx   . Asthma Neg Hx   . Birth defects Neg Hx   . Cancer Neg Hx   . COPD Neg Hx   . Depression Neg  Hx   . Drug abuse Neg Hx   . Diabetes Neg Hx   . Early death Neg Hx   . Hearing loss Neg Hx   . Heart disease Neg Hx   . Hyperlipidemia Neg Hx   . Hypertension Neg Hx   . Kidney disease Neg Hx   . Learning disabilities Neg Hx   . Mental illness Neg Hx   . Mental retardation Neg Hx   . Miscarriages / Stillbirths Neg Hx   . Stroke Neg Hx   . Vision loss Neg Hx   . Varicose Veins Neg Hx      Current Facility-Administered Medications:  .  0.9 %  sodium chloride infusion, , Intravenous, Continuous, Lance Coon, MD, Last Rate: 125 mL/hr at 09/16/16 0415 .  acetaminophen (TYLENOL) tablet 650 mg, 650 mg, Oral, Q6H PRN **OR** acetaminophen (TYLENOL) suppository 650 mg, 650 mg, Rectal, Q6H PRN, Lance Coon, MD .  aspirin chewable tablet 81 mg, 81 mg, Oral,  Daily, Lance Coon, MD .  heparin injection 5,000 Units, 5,000 Units, Subcutaneous, Q8H, Lance Coon, MD, 5,000 Units at 09/16/16 [phone] .  morphine 2 MG/ML injection 2 mg, 2 mg, Intravenous, Q4H PRN, Lance Coon, MD .  ondansetron [facility]) tablet 4 mg, 4 mg, Oral, Q6H PRN **OR** ondansetron (ZOFRAN) injection 4 mg, 4 mg, Intravenous, Q6H PRN, Lance Coon, MD .  oxyCODONE (Oxy IR/ROXICODONE) immediate release tablet 5 mg, 5 mg, Oral, Q4H PRN, Lance Coon, MD .  sodium chloride flush (NS) 0.9 % injection 3 mL, 3 mL, Intravenous, Q12H, Lance Coon, MD, 3 mL at 09/16/16 0345   Physical exam:  Vitals:   09/15/16 1930 09/16/16 0100 09/16/16 0308 09/16/16 0458  BP: 135/74 116/77 117/62 129/63  Pulse: 99 93 (!) 103 (!) 103  Resp: _0 Temp:   98.7 F (37.1 C) 98.5 F (36.9 C)  TempSrc:   Oral Oral  SpO2: 98% 96% 96% 94%  Weight:      Height:       Physical Exam  Constitutional: He is oriented to person, place, and time.  Fatigued, ill appearing  HENT:  Head: Normocephalic and atraumatic.  Eyes: EOM are normal. Pupils are equal, round, and reactive to light.  Neck: Normal range of motion.  Cardiovascular: Regular rhythm and normal heart sounds.   tachycardic  Pulmonary/Chest: Effort normal and breath sounds normal.  Abdominal: Soft. Bowel sounds are normal.  Neurological: He is alert and oriented to person, place, and time.  Skin: Skin is warm and dry.       CMP Latest Ref Rng & Units 09/16/2016  Glucose 65 - 99 mg/dL 89  BUN 6 - 20 mg/dL 30(H)  Creatinine 0.61 - 1.24 mg/dL 1.69(H)  Sodium 135 - 145 mmol/L 134(L)  Potassium 3.5 - 5.1 mmol/L 3.9  Chloride 101 - 111 mmol/L 106  CO2 22 - 32 mmol/L 24  Calcium 8.9 - 10.3 mg/dL 11.4(H)  Total Protein 6.5 - 8.1 g/dL -  Total Bilirubin 0.3 - 1.2 mg/dL -  Alkaline Phos 38 - 126 U/L -  AST 15 - 41 U/L -  ALT 17 - 63 U/L -   CBC Latest Ref Rng & Units 09/16/2016  WBC 3.8 - 10.6 K/uL 91.5(HH)  Hemoglobin 13.0 - 18.0  g/dL 9.6(L)  Hematocrit 40.0 - 52.0 % 29.6(L)  Platelets 150 - 440 K/uL 367    _1 @  Ct Abdomen Pelvis Wo Contrast  Result Date: 09/15/2016 CLINICAL DATA:  Metastatic renal  cell carcinoma. Generalized abdominal pain. EXAM: CT CHEST, ABDOMEN AND PELVIS WITHOUT CONTRAST TECHNIQUE: Multidetector CT imaging of the chest, abdomen and pelvis was performed following the standard protocol without IV contrast. COMPARISON:  CT from 08/27/2016 FINDINGS: CT CHEST FINDINGS Cardiovascular: The heart size is normal. No pericardial effusion. Coronary stent noted. Atherosclerotic calcification is noted in the wall of the thoracic aorta. Mediastinum/Nodes: No mediastinal lymphadenopathy. No evidence for gross hilar lymphadenopathy although assessment is limited by the lack of intravenous contrast on today's study. The esophagus has normal imaging features. There is no axillary lymphadenopathy. Lungs/Pleura: Multiple bilateral pulmonary nodules are identified. One of the dominant right-sided nodules is identified anterior right lower lobe (image 99 series 4) and measures 17 mm. Dominant left-sided nodule is identified in the upper lobe and measures 18 mm. Other scattered bilateral pulmonary nodules range in size from 3-4 mm up to about 16 mm. Musculoskeletal: Bone windows reveal no worrisome lytic or sclerotic osseous lesions. CT ABDOMEN PELVIS FINDINGS Hepatobiliary: Multiple liver lesions are evident, but difficult to assess given the lack of intravenous contrast. 2.8 cm lesion identified anterior liver on image 55. 2.2 cm lesion identified inferior right liver image 68. Other liver lesions are seen scattered in both hepatic lobes. Calcified gallstones evident. No intrahepatic or extrahepatic biliary dilation. Pancreas: No focal mass lesion. No dilatation of the main duct. No intraparenchymal cyst. No peripancreatic edema. Spleen: No splenomegaly. No focal mass lesion. Adrenals/Urinary Tract: Right adrenal gland  unremarkable. Left adrenal lesion measures 4.5 x 3.4 cm today compared to 3.1 x 2.6 cm previously. Left upper pole renal mass difficult to measure given lack of intravenous contrast. It is probably about 6.4 x 6.0 cm today compared to 5.2 x 5.8 cm previously. Right kidney unremarkable. No hydroureter. The urinary bladder appears normal for the degree of distention. Stomach/Bowel: Stomach is nondistended. No gastric wall thickening. No evidence of outlet obstruction. Duodenum is normally positioned as is the ligament of Treitz. No small bowel wall thickening. No small bowel dilatation. The terminal ileum is normal. The appendix is not visualized, but there is no edema or inflammation in the region of the cecum. Diverticular changes are noted in the left colon without evidence of diverticulitis. Vascular/Lymphatic: There is abdominal aortic atherosclerosis without aneurysm. Retroperitoneal lymphadenopathy is progressed. 15 mm left para-aortic lymph node measured previously is now 23 mm. 12 mm short axis lymph node measured previously is now 13 mm. 13 mm left para-aortic lymph node seen on image 68 series 2 was 9 mm when remeasured on prior study. Reproductive: Prostate gland is enlarged. Other: No intraperitoneal free fluid. Musculoskeletal: Bone windows reveal no worrisome lytic or sclerotic osseous lesions. IMPRESSION: 1. Multiple bilateral pulmonary nodules consistent with metastatic disease. 2. Interval progression of the left upper pole renal mass previously characterized on contrast infused CT and MRI. 3. Interval progression of metastatic disease in the liver and left para-aortic space. 4. Left colonic diverticulosis without diverticulitis. 5.  Abdominal Aortic Atherosclerois (ICD10-170.0) Electronically Signed   By: Misty Stanley M.D.   On: 09/15/2016 20:34   Ct Head Wo Contrast  Result Date: 09/15/2016 CLINICAL DATA:  83 year old male with confusion and altered mental status. History of metastatic renal  cell carcinoma. EXAM: CT HEAD WITHOUT CONTRAST TECHNIQUE: Contiguous axial images were obtained from the base of the skull through the vertex without intravenous contrast. COMPARISON:  None. FINDINGS: Brain: Small right posterior parietal infarct is age indeterminate but probably remote. Mild generalized cerebral volume loss and mild probable chronic  small-vessel white matter ischemic changes noted. There is no evidence of hemorrhage, midline shift, hydrocephalus or extra-axial collection. Vascular:  Mild intracranial atherosclerotic calcifications noted. Skull: Normal. Negative for fracture or focal lesion. Sinuses/Orbits: No acute finding. Other: None. 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 No evidence of hemorrhage. Mild atrophy and chronic small-vessel white matter ischemic changes. Electronically Signed   By: Margarette Canada M.D.   On: 09/15/2016 20:14   Ct Chest Wo Contrast  Result Date: 09/15/2016 CLINICAL DATA:  Metastatic renal cell carcinoma. Generalized abdominal pain. EXAM: CT CHEST, ABDOMEN AND PELVIS WITHOUT CONTRAST TECHNIQUE: Multidetector CT imaging of the chest, abdomen and pelvis was performed following the standard protocol without IV contrast. COMPARISON:  CT from 08/27/2016 FINDINGS: CT CHEST FINDINGS Cardiovascular: The heart size is normal. No pericardial effusion. Coronary stent noted. Atherosclerotic calcification is noted in the wall of the thoracic aorta. Mediastinum/Nodes: No mediastinal lymphadenopathy. No evidence for gross hilar lymphadenopathy although assessment is limited by the lack of intravenous contrast on today's study. The esophagus has normal imaging features. There is no axillary lymphadenopathy. Lungs/Pleura: Multiple bilateral pulmonary nodules are identified. One of the dominant right-sided nodules is identified anterior right lower lobe (image 99 series 4) and measures 17 mm. Dominant left-sided nodule is identified in the upper  lobe and measures 18 mm. Other scattered bilateral pulmonary nodules range in size from 3-4 mm up to about 16 mm. Musculoskeletal: Bone windows reveal no worrisome lytic or sclerotic osseous lesions. CT ABDOMEN PELVIS FINDINGS Hepatobiliary: Multiple liver lesions are evident, but difficult to assess given the lack of intravenous contrast. 2.8 cm lesion identified anterior liver on image 55. 2.2 cm lesion identified inferior right liver image 68. Other liver lesions are seen scattered in both hepatic lobes. Calcified gallstones evident. No intrahepatic or extrahepatic biliary dilation. Pancreas: No focal mass lesion. No dilatation of the main duct. No intraparenchymal cyst. No peripancreatic edema. Spleen: No splenomegaly. No focal mass lesion. Adrenals/Urinary Tract: Right adrenal gland unremarkable. Left adrenal lesion measures 4.5 x 3.4 cm today compared to 3.1 x 2.6 cm previously. Left upper pole renal mass difficult to measure given lack of intravenous contrast. It is probably about 6.4 x 6.0 cm today compared to 5.2 x 5.8 cm previously. Right kidney unremarkable. No hydroureter. The urinary bladder appears normal for the degree of distention. Stomach/Bowel: Stomach is nondistended. No gastric wall thickening. No evidence of outlet obstruction. Duodenum is normally positioned as is the ligament of Treitz. No small bowel wall thickening. No small bowel dilatation. The terminal ileum is normal. The appendix is not visualized, but there is no edema or inflammation in the region of the cecum. Diverticular changes are noted in the left colon without evidence of diverticulitis. Vascular/Lymphatic: There is abdominal aortic atherosclerosis without aneurysm. Retroperitoneal lymphadenopathy is progressed. 15 mm left para-aortic lymph node measured previously is now 23 mm. 12 mm short axis lymph node measured previously is now 13 mm. 13 mm left para-aortic lymph node seen on image 68 series 2 was 9 mm when remeasured on  prior study. Reproductive: Prostate gland is enlarged. Other: No intraperitoneal free fluid. Musculoskeletal: Bone windows reveal no worrisome lytic or sclerotic osseous lesions. IMPRESSION: 1. Multiple bilateral pulmonary nodules consistent with metastatic disease. 2. Interval progression of the left upper pole renal mass previously characterized on contrast infused CT and MRI. 3. Interval progression of metastatic disease in the liver and left para-aortic space. 4. Left colonic diverticulosis without diverticulitis. 5.  Abdominal Aortic  Atherosclerois (ICD10-170.0) Electronically Signed   By: Misty Stanley M.D.   On: 09/15/2016 20:34   Mr Abdomen Wwo Contrast  Result Date: 08/30/2016 CLINICAL DATA:  83 year old male with history of left-sided abdominal pain and low-grade fever for 1 month. Left-sided flank pain. Left renal mass noted on recent CT examination. EXAM: MRI ABDOMEN WITHOUT AND WITH CONTRAST TECHNIQUE: Multiplanar multisequence MR imaging of the abdomen was performed both before and after the administration of intravenous contrast. CONTRAST:  75m MULTIHANCE GADOBENATE DIMEGLUMINE 529 MG/ML IV SOLN COMPARISON:  No prior abdominal MRI. CT the abdomen and pelvis 08/27/2016. FINDINGS: Lower chest: Unremarkable. Hepatobiliary: The liver is incompletely visualized on several pulse sequences. With these limitations in mind, there are multiple T2 hyperintense lesions in the liver, most of which are T1 hypointense and do not enhance centrally; however, these lesions have an unusual halo of T2 hyperintensity surrounding them, and on the post gadolinium images they. A have a thick rind of subtle enhancement. The exception is a 2.7 x 2.5 cm lesion in segment 6 (image 9 of series 4) which is mildly T1 hyperintense (presumably related to some central hemorrhage or necrosis), T2 hypointense, and otherwise similar in appearance, which clearly demonstrates restricted diffusion. These lesions are suspicious for  metastatic lesions. A few other smaller T1 hypointense, T2 hyperintense, nonenhancing lesions are compatible with tiny cysts and/or biliary hamartomas. No intra or extrahepatic biliary ductal dilatation. Gallbladder is nearly completely contracted around 2 large gallstones, measuring up to 16 mm in the fundus. No findings to suggest an acute cholecystitis at this time. Pancreas: Well-circumscribed 6 mm T1 hypointense, T2 hyperintense, nonenhancing lesion in the uncinate process of the pancreas, likely to represent a tiny pancreatic pseudocyst, but incompletely characterized. No larger more suspicious appearing pancreatic mass. No pancreatic ductal dilatation. No pancreatic or peripancreatic fluid or inflammatory changes. Spleen:  Unremarkable. Adrenals/Urinary Tract: Throughout the mid and upper pole of the left kidney there is a large heterogeneously enhancing mass which measures 5.4 x 5.9 x 5.3 cm (axial image 36 of series 13 and coronal image 32 of series 14) which appears encapsulated within Gerota's fascia, but does appear to extend into the proximal aspect of the left renal vein, best appreciated on axial image 36 of series 13. Right kidney and right adrenal gland are normal in appearance. Heterogeneously enhancing mass in the left adrenal gland, concerning for metastatic lesion, measuring 3.8 x 2.1 cm (axial image 22 of series 13). No hydroureteronephrosis in the visualized portions of the abdomen. Stomach/Bowel: Visualized portions are unremarkable. Vascular/Lymphatic: No aneurysm identified in the visualized abdominal vasculature. Probable tumor thrombus extending into the proximal aspect of the left renal vein (this does not cross the midline, and does not extend to the IVC). Several enlarged left-sided para-aortic lymph nodes are noted measuring up to 13 mm in short axis adjacent to the left renal hilum (image 40 of series 11). One of these enlarged left para-aortic lymph nodes appears centrally necrotic  (12 mm lymph node on image 21 of series 11). Other:  No significant volume of ascites.  No pneumoperitoneum. Musculoskeletal: No aggressive appearing lesions are noted in the visualized portions of the skeleton. IMPRESSION: 1. 5.4 x 5.9 x 5.3 cm left renal mass involving the middle/upper pole with extension into the proximal aspect of the left renal vein (not extending to the IVC), left adrenal metastasis, multiple liver metastases and left periaortic metastatic lymphadenopathy. 2. 6 mm tiny cystic appearing lesion in the uncinate process of the pancreas. Repeat  MRI of the abdomen with and without IV gadolinium with MRCP is suggested in 2 years to ensure stability of this lesion. This recommendation follows ACR consensus guidelines: Management of Incidental Pancreatic Cysts: A White Paper of the ACR Incidental Findings Committee. [address] [id number]. 3. Additional incidental findings, as above. Electronically Signed   By: Vinnie Langton M.D.   On: 08/30/2016 09:36   Ct Abdomen Pelvis W Contrast  Result Date: 08/27/2016 CLINICAL DATA:  Left lower quadrant pain and low-grade fever for 1 month. EXAM: CT ABDOMEN AND PELVIS WITH CONTRAST TECHNIQUE: Multidetector CT imaging of the abdomen and pelvis was performed using the standard protocol following bolus administration of intravenous contrast. CONTRAST:  177m ISOVUE-300 IOPAMIDOL (ISOVUE-300) INJECTION 61% COMPARISON:  100 mL Isovue 300 FINDINGS: Lower Chest: Several bibasilar pulmonary nodules are seen bilaterally, largest in the right lower lobe measuring 5 mm. Hepatobiliary: Several small low-attenuation lesions are seen within the liver, several which are too small to characterize. Several contain irregular margins with possible mild rim enhancement, and have higher attenuation than simple cysts. Several calcified gallstones are seen, without evidence of cholecystitis or biliary ductal dilatation. Pancreas:  No mass or inflammatory changes.  Spleen: Within normal limits in size and appearance. Adrenals/Urinary Tract: A heterogeneous mass is seen involving the left adrenal gland which measures 2.6 x 3.1 cm. Ill-defined heterogeneous low-attenuation mass in the upper and mid pole of left kidney measuring 5.2 x 5.8 cm. This shows extension into the left renal sinus and mild dilatation lower pole intrarenal collecting system. This is suspicious for malignancy, with pyelonephritis considered less likely. Stomach/Bowel: No evidence of obstruction, inflammatory process or abnormal fluid collections. Sigmoid diverticulosis noted, without evidence of diverticulitis. Vascular/Lymphatic: Mild left retroperitoneal lymphadenopathy is seen with largest lymph node in left paraaortic region measuring 12 mm on image 32/2. A partially necrotic celiac axis lymph node is also seen measuring 15 mm on image 21/2. No pelvic lymphadenopathy identified. No abdominal aortic aneurysm.  Aortic atherosclerosis. Reproductive:  No mass identified. Other: Small left inguinal hernia is seen containing only fat. Small right inguinal hernia contains a small portion of the urinary bladder. Musculoskeletal:  No suspicious bone lesions identified. IMPRESSION: 5.8 cm ill-defined heterogeneous mass in the upper and midpole of left kidney. This is suspicious for malignancy, with pyelonephritis considered less likely. Mild left abdominal retroperitoneal and celiac axis lymphadenopathy, suspicious for metastatic disease. 3.1 cm indeterminate left adrenal mass and small indeterminate low-attenuation liver lesions. Recommend abdomen MRI without and with contrast for further characterization. Indeterminate sub-cm bibasilar pulmonary nodules. Consider chest CT for further evaluation . Additional findings including aortic atherosclerosis, cholelithiasis, colonic diverticulosis, and bilateral inguinal hernias. Electronically Signed   By: JEarle GellM.D.   On: 08/27/2016 14:10   [place]  Result  Date: 09/12/2016 CLINICAL DATA:  Left renal mass and multiple liver lesions. EXAM: ULTRASOUND GUIDED CORE BIOPSY OF LIVER MEDICATIONS: 1.0 mg IV Versed; 50 mcg IV Fentanyl Total Moderate Sedation Time: 12 minutes. The patient's level of consciousness and physiologic status were continuously monitored during the procedure by Radiology nursing. PROCEDURE: The procedure, risks, benefits, and alternatives were explained to the patient. Questions regarding the procedure were encouraged and answered. The patient understands and consents to the procedure. A time out was performed prior to initiating the procedure. The right abdominal wall was prepped with chlorhexidine in a sterile fashion, and a sterile drape was applied covering the operative field. A sterile gown and sterile gloves were used for the  procedure. Local anesthesia was provided with 1% Lidocaine. Ultrasound was used to localize a lesion within the right lobe of the liver. Under ultrasound guidance, a 17 gauge needle was advanced to the margin of the lesion. A series of 3 separate 18 gauge core biopsy samples were then obtained through the 17 gauge needle. Core biopsy samples were submitted in formalin. The outer needle was removed and additional ultrasound performed. COMPLICATIONS: None. FINDINGS: Lesion within the inferior right lobe of the liver demonstrates central necrosis and measures approximately 2 cm in maximal diameter. Solid tissue was obtained with core biopsy which was targeted towards the periphery of the lesion. IMPRESSION: Ultrasound-guided core biopsy performed of a lesion within the inferior right lobe of the liver. Electronically Signed   By: Irish Lack M.D.   On: 09/12/2016 12:51    Assessment and plan- Patient is a 83 y.o. male with a h/o newly diagnosed metastatic urothelial carcinoma with liver and lung metastases  I met with the patient and hsi wife at the bedside and explained the results of his scans and his pathology. I  explained to him that he has a verry aggressive and fast growing malignancy. Chemotherapy could be attempted but it would be palliative and not curative. Without any treatment his prognosis is likely in weeks to <3 months. With treatment it may be upto a year. Goal of treatment would be to improve his QOL and longevity. Treatment would involve frequent doctor vists, tests including blood draws and potential side effects of chemotherapy. Patient clearly understands his clinical condition. He was able to tell me what his options are at this time after my conversation and is very clear about the fact that he does not wish to undergo any palliative treatment for the same. He however tells me that he does not wish to be a burden to his wife and would like to "end this soon". I explained to him that we can involve palliative care and home going with hospice is a reasonable option. That hospice would focus on his symptoms and QOL and that would mean no tests or repeat hospitalizations. We would let nature take its course but by no means we can make him end his life sooner with medications or other interventions. Patient told me in that case it doesn't make sense to "go on like this and he would rather find something on his own"  Patients wife and I again reiterated to him that keeping him comfortable would be our goal. Patient at this time agrees to speak to palliative care team and hospice and strongly consider going home.  Patient was found to have a gun when EMS arrived yesterday which the patients wife was not aware that he had a gun at home. She does plan to lock it and jeep it away from him. I do feel psychiatry input would be needed given his suicidal ideation. However, I do think that patient has decision making capacity to not pursue aggressive treatments including chemotherapy and focus on comfort measures only.  Recommend prn thorazine for hiccups. Patient says his pain is not significant at this time and  does not want any pain meds. I also discussed his CODE status and he would like to be DNR/DNI    Total face to face encounter time for this patient visit was 60 min. >50% of the time was  spent in counseling and coordination of care.       Thank you for this kind referral and the opportunity  to participate in the care of this patient   Visit Diagnosis 1. Hypercalcemia   2. Confusion   3. Generalized abdominal pain   4. Leukocytosis, unspecified type   5. Suicidal ideation   6. Acute encephalopathy   7. Elevated uric acid in blood     Dr. Randa Evens, MD, MPH [facility] at [facility] Pager- [phone] 09/16/2016 9:54 AM

## 2016-09-17 ENCOUNTER — Other Ambulatory Visit: Payer: Self-pay | Admitting: *Deleted

## 2016-09-17 MED ORDER — LORAZEPAM 1 MG PO TABS: 1.0000 mg | ORAL_TABLET | ORAL | 0 refills | Status: AC | PRN

## 2016-09-17 MED ORDER — MORPHINE SULFATE (CONCENTRATE) 20 MG/ML PO SOLN: 10.0000 mg | ORAL | 0 refills | Status: AC | PRN

## 2016-09-17 MED ORDER — CHLORPROMAZINE HCL 25 MG PO TABS
50.0000 mg | ORAL_TABLET | Freq: Three times a day (TID) | ORAL | Status: DC
  Administered 2016-09-17: 50 mg via ORAL
  Filled 2016-09-17: qty 2

## 2016-09-17 MED ORDER — ENSURE ENLIVE PO LIQD: 237.0000 mL | Freq: Three times a day (TID) | ORAL | 12 refills | Status: AC

## 2016-09-17 MED ORDER — ONDANSETRON 4 MG PO TBDP: 4.0000 mg | ORAL_TABLET | ORAL | 0 refills | Status: AC | PRN

## 2016-09-17 MED ORDER — SODIUM CHLORIDE 0.9 % IV SOLN
Freq: Once | INTRAVENOUS | Status: AC
  Administered 2016-09-17: 06:00:00 via INTRAVENOUS

## 2016-09-17 MED ORDER — CHLORPROMAZINE HCL 50 MG PO TABS: 50.0000 mg | ORAL_TABLET | Freq: Three times a day (TID) | ORAL | 2 refills | Status: AC

## 2016-09-17 NOTE — Clinical Social Work Note (Signed)
Pt is ready for discharge today and will discharge to [facility]. Pt's wife is aware and in agreement to discharge plan. Hospital Liaison made arrangements for transfer. CSW prepared discharge packet. CSW provided supportive counseling to pt's wife. CSW is signing off as no further needs identified.   Darden Dates, MSW, LCSW  Clinical Social Worker  [phone]

## 2016-09-17 NOTE — Progress Notes (Signed)
Pt's BP 89/35. MD notified. Orders obtained

## 2016-09-17 NOTE — Care Management (Signed)
Admitted to [facility] with the diagnosis of tumor lysis. Lives with wife, Ailene Ravel [phone]), Last seen Dr. Lovie Macadamia 2-3 weeks ago. Prescriptions are filled at [facility] [address]. No equipment in the home. No home health, skilled facility, or oxygen in the home. Last fall was Saturday prior to coming to hospital. Fair appetite.  Spoke with Dr. Tressia Miners this morning. Wife wants to take husband home.  Telephone call to wife. She is unsure if she can take care of her husband in the home. States she has friends coming to [place] from [place]. The friends will help her with plans. Shelbie Ammons RN MSN CCM Care Management

## 2016-09-17 NOTE — Discharge Summary (Signed)
[facility] [place] NAME: Michael Brennan    MR#:  [id number]  DATE OF BIRTH:  06-01-1934  DATE OF ADMISSION:  09/15/2016   ADMITTING PHYSICIAN: Lance Coon, MD  DATE OF DISCHARGE: 09/17/16  PRIMARY CARE PHYSICIAN: Juluis Pitch, MD   ADMISSION DIAGNOSIS:   Hypercalcemia [E83.52] Suicidal ideation [R45.851] Confusion [R41.0] Generalized abdominal pain [R10.84] Acute encephalopathy [G93.40] Elevated uric acid in blood [E79.0] Leukocytosis, unspecified type [D72.829] Metastatic urothelial carcinoma (HCC) [C79.10]  DISCHARGE DIAGNOSIS:   Principal Problem:   Tumor lysis syndrome Active Problems:   Metastatic urothelial carcinoma (HCC)   CAD (coronary artery disease)   Leukocytosis   Suicidal ideation   AKI (acute kidney injury) ([place])   Depressive disorder due to metastatic cancer   SECONDARY DIAGNOSIS:   Past Medical History:  Diagnosis Date  . AKI (acute kidney injury) ([place]) 09/15/2016  . Arthritis    hips  . Cataract   . History of kidney stones   . Metastatic urothelial carcinoma ([place]) 09/15/2016  . NSTEMI (non-ST elevated myocardial infarction) ([place])   . Skin cancer     HOSPITAL COURSE:   83 year old male with past medical history significant for skin cancer, arthritis who was recently diagnosed with metastatic urothelial carcinoma 3 days ago presents to hospital secondary to altered mental status and hypercalcemia.  #1 hypercalcemia-secondary to metastatic tumor, tumor lysis. -Received palmedronic acid on admission. Calcium improved transiently.  - fluids have been discontinued as plans for no further treatment according to patient's wishes was completely alert and oriented.  -Case has been discussed with oncology as well. -No further blood draws have been ordered - only comfort meds at this time. - plans to discharge to hospice facility per family request  #2 leukocytosis-reactive leukocytosis with  neutrophilia secondary to his underlying malignancy. No lymphoma or leukemia per oncology. -Blood cultures were drawn. But no plans for any antibiotics.  #3 acute renal failure-likely secondary to upper urothelial carcinoma  #4 Metastatic upper urothelial carcinoma- has large left renal mass, liver mets, lung mets - Oncology consult appreciated- all options presented to patient and he understands and opts for no further tests and treatment and hospice care - hospice eval,and plans for discharge to hospice facility today or tomorrow - thorazine for hiccups ordered - no further labs  #5 Depression with suicidal ideation- on involuntary commitment - psych consult appreciated, sitter at bedside - patient understands the nature of his aggressive tumor and was suicidal- now anyways going with hospice - no further treatment - monitor  Discharge to hospice facility today or tomorrow   DISCHARGE CONDITIONS:   Critical  CONSULTS OBTAINED:   Treatment Team:  Sindy Guadeloupe, MD Hildred Priest, MD  DRUG ALLERGIES:   No Known Allergies DISCHARGE MEDICATIONS:   Allergies as of 09/17/2016   No Known Allergies     Medication List    STOP taking these medications   levofloxacin 750 MG tablet Commonly known as:  LEVAQUIN   nitroGLYCERIN 0.4 MG SL tablet Commonly known as:  NITROSTAT   oxyCODONE 5 MG immediate release tablet Commonly known as:  Oxy IR/ROXICODONE     TAKE these medications   aspirin 81 MG chewable tablet Chew by mouth.   chlorproMAZINE 50 MG tablet Commonly known as:  THORAZINE Take 1 tablet (50 mg total) by mouth 3 (three) times daily.   feeding supplement (ENSURE ENLIVE) Liqd Take 237 mLs by mouth 3 (three) times daily with meals.  LORazepam 1 MG tablet Commonly known as:  ATIVAN Take 1 tablet (1 mg total) by mouth every 4 (four) hours as needed for anxiety.   morphine 20 MG/ML concentrated solution Commonly known as:  ROXANOL Take 0.5  mLs (10 mg total) by mouth every 2 (two) hours as needed for moderate pain, severe pain or shortness of breath.   ondansetron 4 MG disintegrating tablet Commonly known as:  ZOFRAN ODT Take 1 tablet (4 mg total) by mouth every 4 (four) hours as needed for nausea or vomiting.        DISCHARGE INSTRUCTIONS:   1. Hospice facility discharge  DIET:   Regular diet  ACTIVITY:   As tolerated  OXYGEN:   Home Oxygen: No Oxygen Delivery: room air  DISCHARGE LOCATION:   Hospice facility  If you experience worsening of your admission symptoms, develop shortness of breath, life threatening emergency, suicidal or homicidal thoughts you must seek medical attention immediately by calling 911 or calling your MD immediately  if symptoms less severe.  You Must read complete instructions/literature along with all the possible adverse reactions/side effects for all the Medicines you take and that have been prescribed to you. Take any new Medicines after you have completely understood and accpet all the possible adverse reactions/side effects.   Please note  You were cared for by a hospitalist during your hospital stay. If you have any questions about your discharge medications or the care you received while you were in the hospital after you are discharged, you can call the unit and asked to speak with the hospitalist on call if the hospitalist that took care of you is not available. Once you are discharged, your primary care physician will handle any further medical issues. Please note that NO REFILLS for any discharge medications will be authorized once you are discharged, as it is imperative that you return to your primary care physician (or establish a relationship with a primary care physician if you do not have one) for your aftercare needs so that they can reassess your need for medications and monitor your lab values.    On the day of Discharge:  VITAL SIGNS:   Blood pressure (!) 118/47,  pulse (!) 115, temperature 99.5 F (37.5 C), temperature source Oral, resp. rate 18, height 5\' 7"  (1.702 m), weight 74.8 kg (165 lb), SpO2 96 %.  PHYSICAL EXAMINATION:   GENERAL:  83 y.o.-year-old patient lying in the bed with no acute distress. Hiccups present EYES: Pupils equal, round, reactive to light and accommodation. No scleral icterus. Extraocular muscles intact.  HEENT: Head atraumatic, normocephalic. Oropharynx and nasopharynx clear.  NECK:  Supple, no jugular venous distention. No thyroid enlargement, no tenderness.  LUNGS: Normal breath sounds bilaterally, no wheezing, rales,rhonchi or crepitation. No use of accessory muscles of respiration.  CARDIOVASCULAR: S1, S2 normal. No murmurs, rubs, or gallops.  ABDOMEN: Soft, nontender,Discomfort on palpating the lower abdomen, nondistended. Bowel sounds present. No organomegaly or mass.  EXTREMITIES: No pedal edema, cyanosis, or clubbing.  NEUROLOGIC: Cranial nerves II through XII are intact. Muscle strength 5/5 in all extremities. Sensation intact. Gait not checked.  PSYCHIATRIC: The patient is alert and oriented x 3. No active depression, some confusion noted today SKIN: No obvious rash, lesion, or ulcer.  DATA REVIEW:   CBC  Recent Labs Lab 09/16/16 0731  WBC 91.5*  HGB 9.6*  HCT 29.6*  PLT 367    Chemistries   Recent Labs Lab 09/15/16 1815 09/16/16 0731  NA 135 134*  K 4.3 3.9  CL 99* 106  CO2 27 24  GLUCOSE 113* 89  BUN 38* 30*  CREATININE 2.08* 1.69*  CALCIUM 12.8* 11.4*  AST 36  --   ALT 30  --   ALKPHOS 156*  --   BILITOT 0.8  --      Microbiology Results  Results for orders placed or performed during the hospital encounter of 09/15/16  CULTURE, BLOOD (ROUTINE X 2) w Reflex to ID Panel     Status: None (Preliminary result)   Collection Time: 09/16/16  8:50 AM  Result Value Ref Range Status   Specimen Description BLOOD RIGHT WRIST  Final   Special Requests BOTTLES DRAWN AEROBIC AND ANAEROBIC BCHV   Final   Culture NO GROWTH < 24 HOURS  Final   Report Status PENDING  Incomplete  CULTURE, BLOOD (ROUTINE X 2) w Reflex to ID Panel     Status: None (Preliminary result)   Collection Time: 09/16/16  8:50 AM  Result Value Ref Range Status   Specimen Description BLOOD RIGHT ARM  Final   Special Requests BOTTLES DRAWN AEROBIC AND ANAEROBIC BCAV  Final   Culture NO GROWTH < 24 HOURS  Final   Report Status PENDING  Incomplete    RADIOLOGY:  No results found.   Management plans discussed with the patient, family and they are in agreement.  CODE STATUS:     Code Status Orders        Start     Ordered   09/16/16 0958  Do not attempt resuscitation (DNR)  Continuous    Question Answer Comment  In the event of cardiac or respiratory ARREST Do not call a "code blue"   In the event of cardiac or respiratory ARREST Do not perform Intubation, CPR, defibrillation or ACLS   In the event of cardiac or respiratory ARREST Use medication by any route, position, wound care, and other measures to relive pain and suffering. May use oxygen, suction and manual treatment of airway obstruction as needed for comfort.      09/16/16 0957    Code Status History    Date Active Date Inactive Code Status Order ID Comments User Context   09/16/2016  3:00 AM 09/16/2016  9:57 AM Full Code [id number]  Lance Coon, MD Inpatient   12/30/2014  4:48 PM 12/31/2014  2:57 PM Full Code [id number]  Isaias Cowman, MD Inpatient   12/29/2014  6:10 PM 12/30/2014  4:48 PM Full Code [id number]  Theodoro Grist, MD Inpatient      TOTAL TIME TAKING CARE OF THIS PATIENT: 38 minutes.    Gladstone Lighter M.D on 09/17/2016 at 10:59 AM  Between 7am to 6pm - Pager - [phone]  After 6pm go to [email or web address] - Proofreader  [facility]  Office  [phone]  CC: Primary care physician; Juluis Pitch, MD   Note: This dictation was prepared with Dragon dictation along with smaller phrase  technology. Any transcriptional errors that result from this process are unintentional.

## 2016-09-17 NOTE — Progress Notes (Signed)
New hospice home referral received from [facility]. Mr. Reierson is an 83 year old man with a PMH of HTN, HLD, arthritis and depresion  admitted to [facility] on 3/10 for evaluation of tumor lysis syndrome. He was just recently diagnosed with metastatic urothelial carcinoma, he presented to the ED with altered mental status and hypercalcemia. Received pamidronate on admission as well as psych eval. Patient has met with Oncology and has chosen not to receive any treatment. He and his wife have met with attending physician Dr. Tressia Miners and he wishes to discharge services to the Hospice home for end of life care. Writer met with patient's wife Sieglunde and family friends Cecilie Lowers and [place] to initiate education regarding hospice services, philosophy and team approach to care with good understanding voiced. Questions answered, consents signed. Patient seen sleeping, per staff report he is sleeping most of the time, will wake to take Thorazine for hiccups, has denied pain.  Patient information and consents faxed to referral. Writer to call report to the Hospice home and call EMS for transport. Hospital Care team and family aware that due to the inclement weather, transport maybe delayed.  Flo Shanks RN, [facility] [place], [facility] [phone] c

## 2016-09-17 NOTE — Progress Notes (Signed)
Report called to the hospice home, EMS notified for transport at 2:00 pm. Mrs. Steffensmeier notified via telephone regarding transport. Staff RN Meghan will contact her when EMS arrives at the hospital to transport him. Thank you. Flo Shanks RN, BSN, [facility] [place], hospital  [phone] c

## 2016-09-17 NOTE — Progress Notes (Signed)
Nutrition Brief Note  Patient identified to be seen for Malnutrition Screening Tool (MST). Chart reviewed and discussed with RN. Patient now on comfort measures and does not want any further treatment. Patient with discharge order in to discharge to hospice facility today.  Recommend liberalizing diet from Heart Healthy to Regular. Continue Ensure Enlive chocolate TID as ordered. Patient would also like to try [employer]. No further nutrition interventions warranted at this time. Please consult Dietitian as needed.   Willey Blade, MS, RD, LDN Pager: [phone] After Hours Pager: [phone]

## 2016-09-17 NOTE — Plan of Care (Signed)
Problem: Fluid Volume: Goal: Ability to maintain a balanced intake and output will improve Outcome: Not Progressing Pt with poor po intake   Problem: Nutrition: Goal: Adequate nutrition will be maintained Outcome: Not Progressing Pt with poor po intake

## 2016-09-17 NOTE — Progress Notes (Signed)
Pt is being discharged, IV removed x1. All belongings packed and returned to the patient. EMS has arrived to transport patient to the hospice home, the patient's wife was notified via phone.

## 2016-09-17 NOTE — Progress Notes (Signed)
Chaplain responded to an OR for a Pt in [id number] who presented suicide thoughts. CH met with the Pt. Pt's family and Chief Executive Officer were at bedside. [place] asked how the Pt was doing, the family answered, "he is not doing well this morning." Pt barely spoke and had struggle speaking to the Chaplain. While the [facility] was still talking to the Pt, the Dc arrived. CH told the Pt, he will visit him again later and exited the Rm. Elida observes that the Pt was not capable of having a constructive conversation with the Ch today. Therefore it would be better to have a follow up visit with this Pt on another day.    09/17/16 1100  Clinical Encounter Type  Visited With Patient;Patient and family together;Health care provider  Visit Type Initial;Psychological support;Spiritual support;Behavioral Health  Referral From Nurse  Consult/Referral To [place] (Comment)

## 2016-09-19 ENCOUNTER — Encounter: Payer: Medicare Other | Attending: Oncology

## 2016-09-19 ENCOUNTER — Ambulatory Visit: Admission: RE | Admit: 2016-09-19 | Payer: Medicare Other | Source: Ambulatory Visit

## 2016-09-20 ENCOUNTER — Telehealth: Payer: Self-pay | Admitting: *Deleted

## 2016-09-20 NOTE — Telephone Encounter (Signed)
error 

## 2016-09-21 ENCOUNTER — Inpatient Hospital Stay: Payer: Medicare Other | Admitting: Oncology

## 2016-09-21 LAB — CULTURE, BLOOD (ROUTINE X 2)
CULTURE: NO GROWTH
Culture: NO GROWTH

## 2016-09-21 LAB — SURGICAL PATHOLOGY

## 2016-10-07 DEATH — deceased

## 2018-02-22 IMAGING — CT CT HEAD W/O CM
2 series · 15 of 37 positions shown, 18 images · non-contrast
Comparison: None.

CLINICAL DATA: 82-year-old male with confusion and altered mental
status. History of metastatic renal cell carcinoma.

EXAM:
CT HEAD WITHOUT CONTRAST
TECHNIQUE: Contiguous axial images were obtained from the base of the skull
through the vertex without intravenous contrast.

[Series 2: head wo · axial · 0.47mm/px · z∈[-168,-23]mm · 12 of 35 slices shown, 15 images]
[im 3/35  brain]
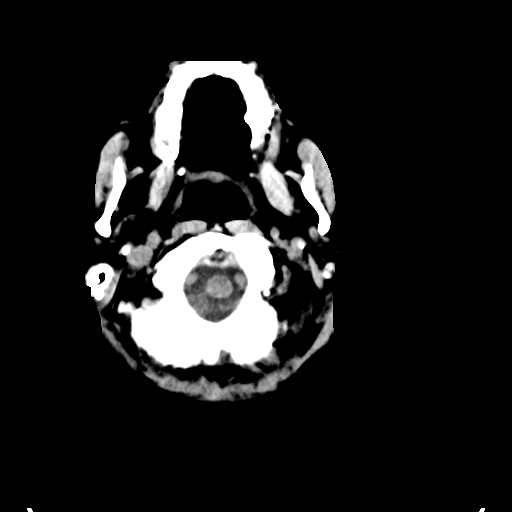
[im 3/35  bone]
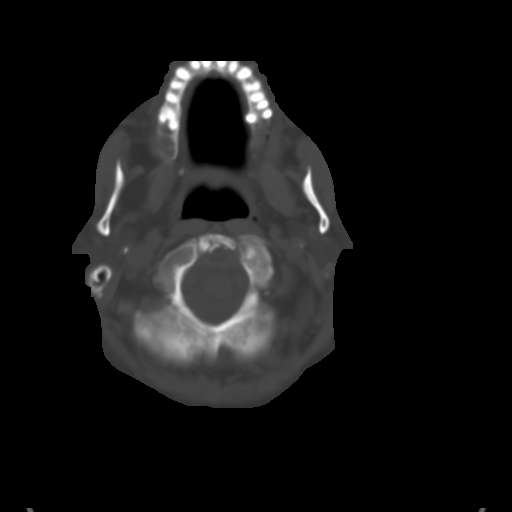
[im 5/35  brain]
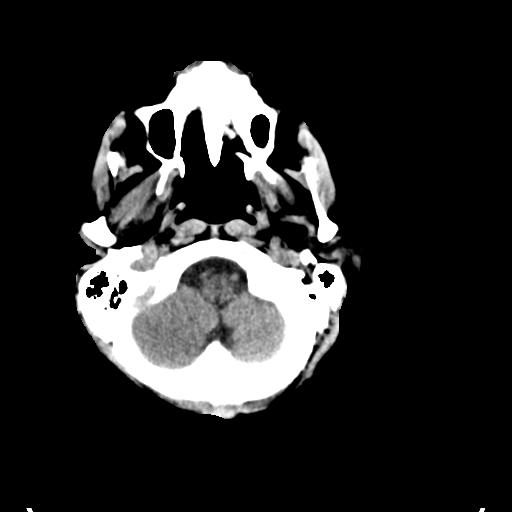
[im 8/35  brain]
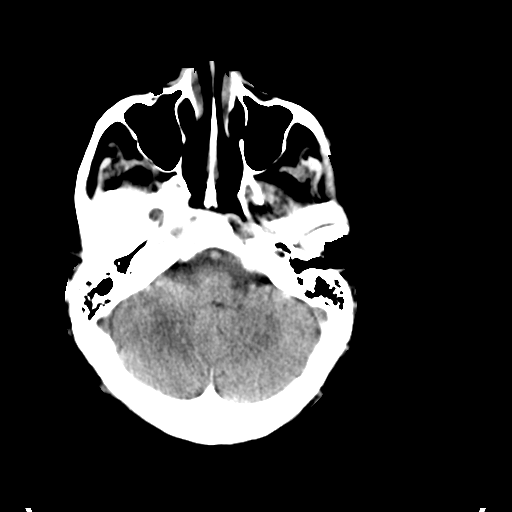
[im 11/35  brain]
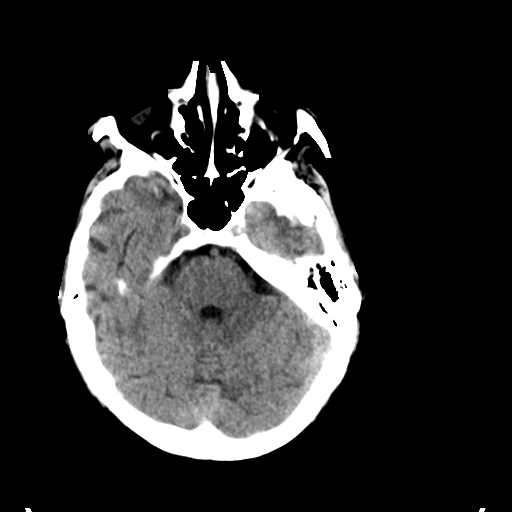
[im 13/35  brain]
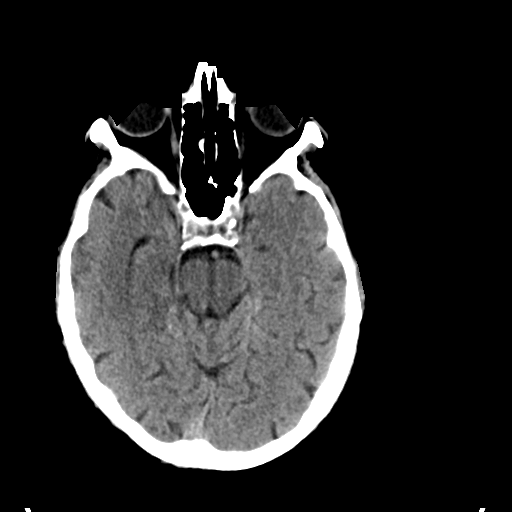
[im 13/35  bone]
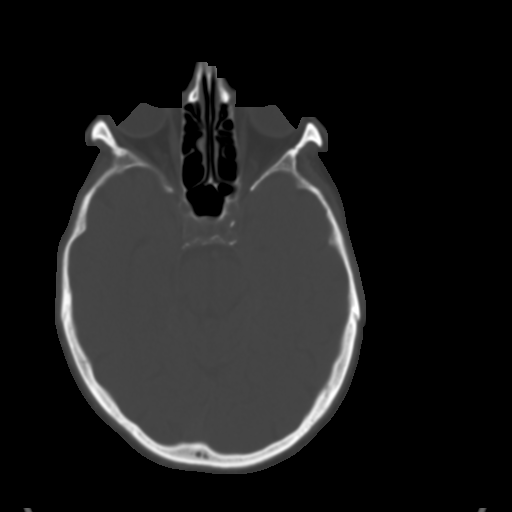
[im 16/35  brain]
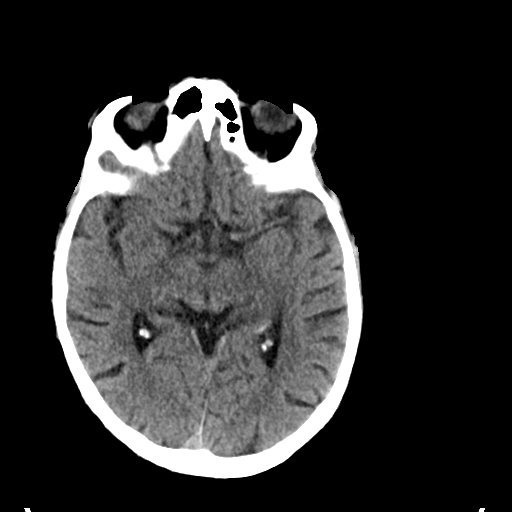
[im 19/35  brain]
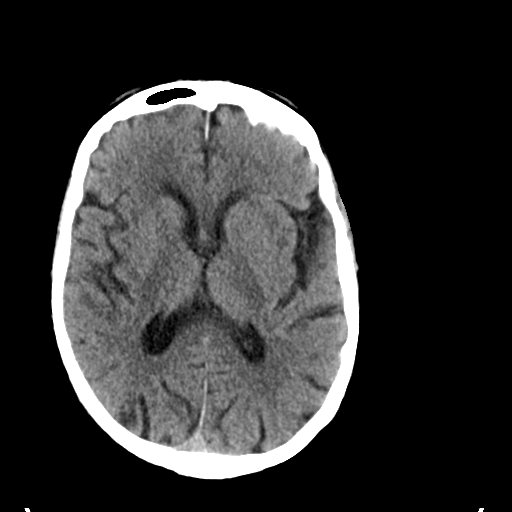
[im 22/35  brain]
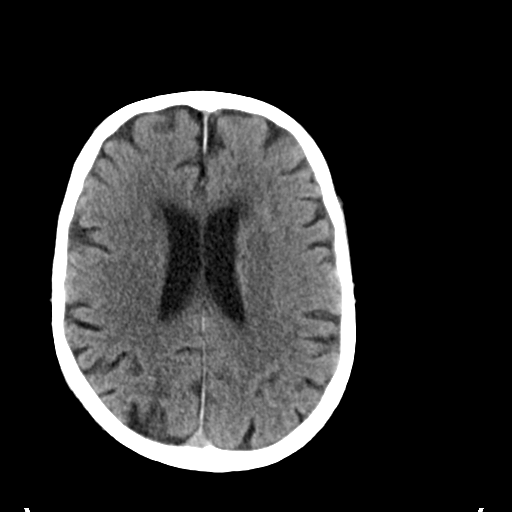
[im 24/35  brain]
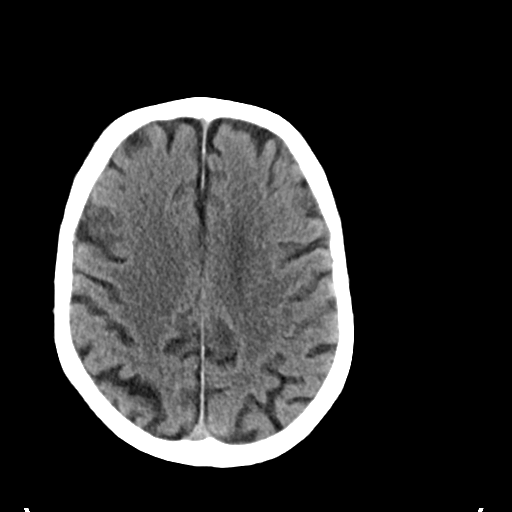
[im 24/35  bone]
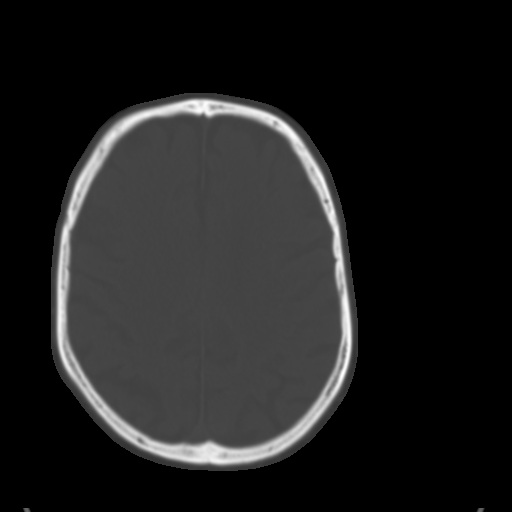
[im 27/35  brain]
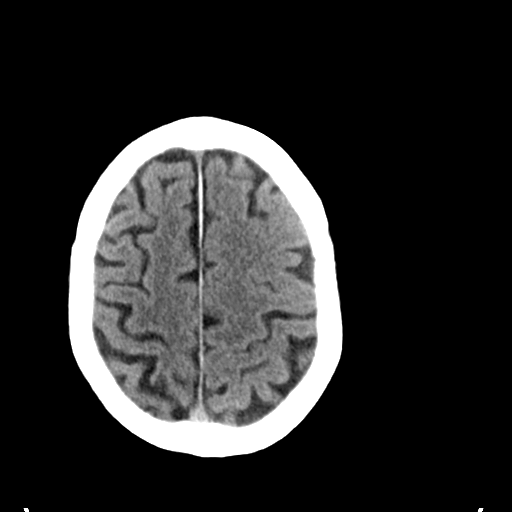
[im 30/35  brain]
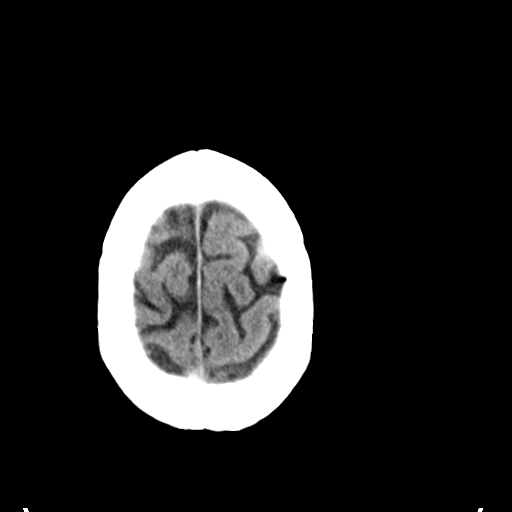
[im 32/35  brain]
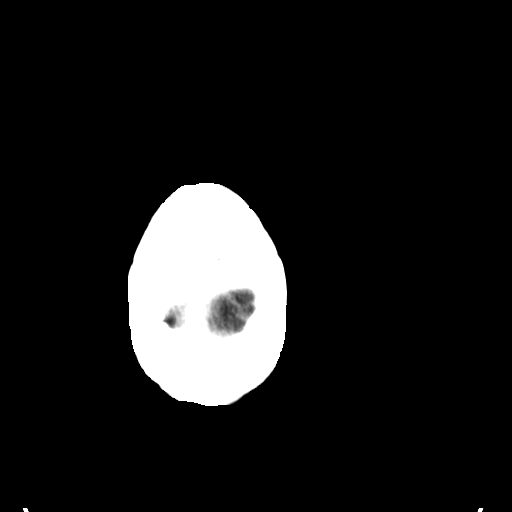

[Series 5: sagittal soft tissue · sagittal · 0.33mm/px · 3 of 49 slices shown]
[im 17/49  brain]
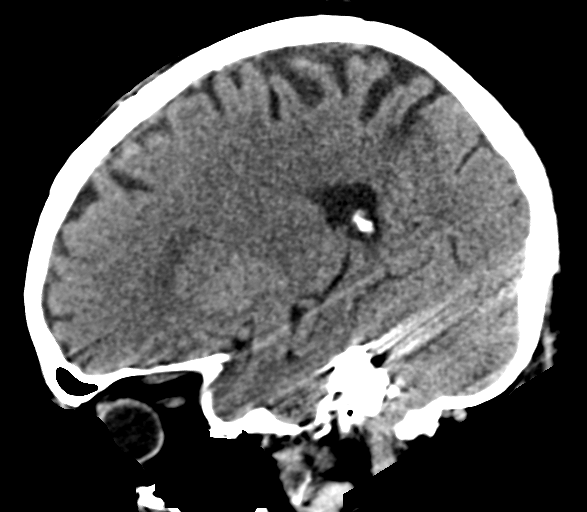
[im 25/49  brain]
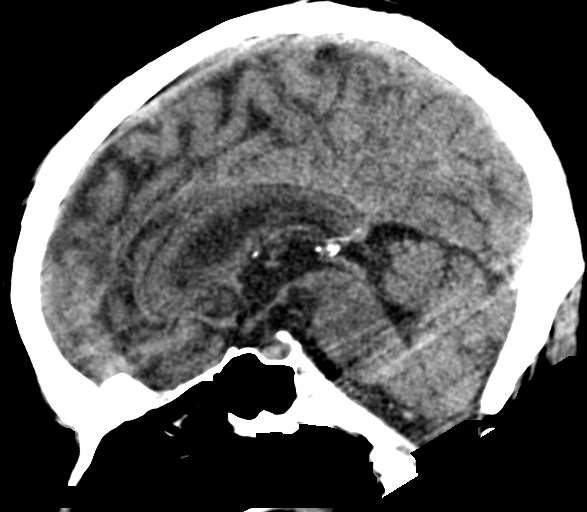
[im 33/49  brain]
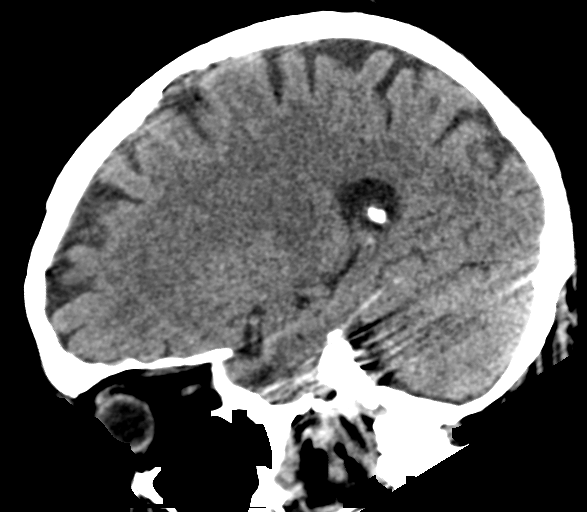

[15 of 37 positions shown; findings below may reference images not displayed]

FINDINGS: Brain: Small right posterior parietal infarct is age indeterminate
but probably remote. Mild generalized cerebral volume loss and mild
probable chronic small-vessel white matter ischemic changes noted.
There is no evidence of hemorrhage, midline shift, hydrocephalus or
extra-axial collection.

Vascular:  Mild intracranial atherosclerotic calcifications noted.

Skull: Normal. Negative for fracture or focal lesion.

Sinuses/Orbits: No acute finding.

Other: None.
IMPRESSION: Age indeterminate right posterior parietal infarct - likely remote.
No evidence of hemorrhage.

Mild atrophy and chronic small-vessel white matter ischemic changes.

## 2018-04-07 IMAGING — US US BIOPSY
1 series · 7 of 7 positions shown · non-contrast
Comparison: none

CLINICAL DATA: Left renal mass and multiple liver lesions.

[Series 1: us biopsy · 0.23mm/px · 7 of 7 slices shown]
[im 1/7]
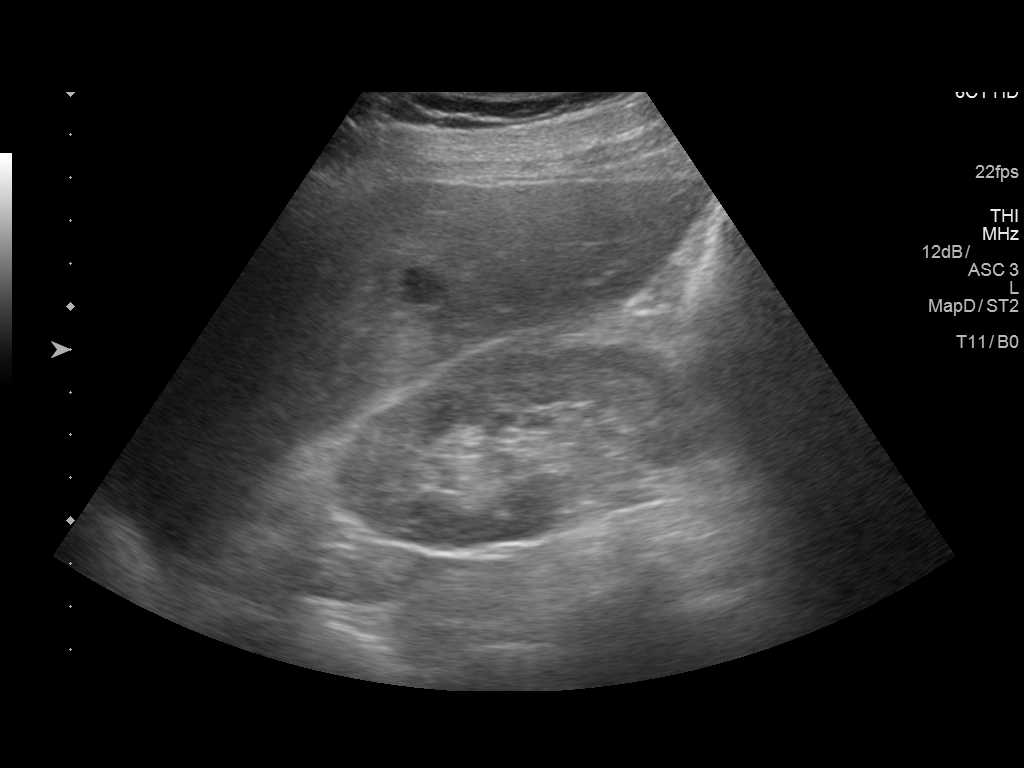
[im 2/7]
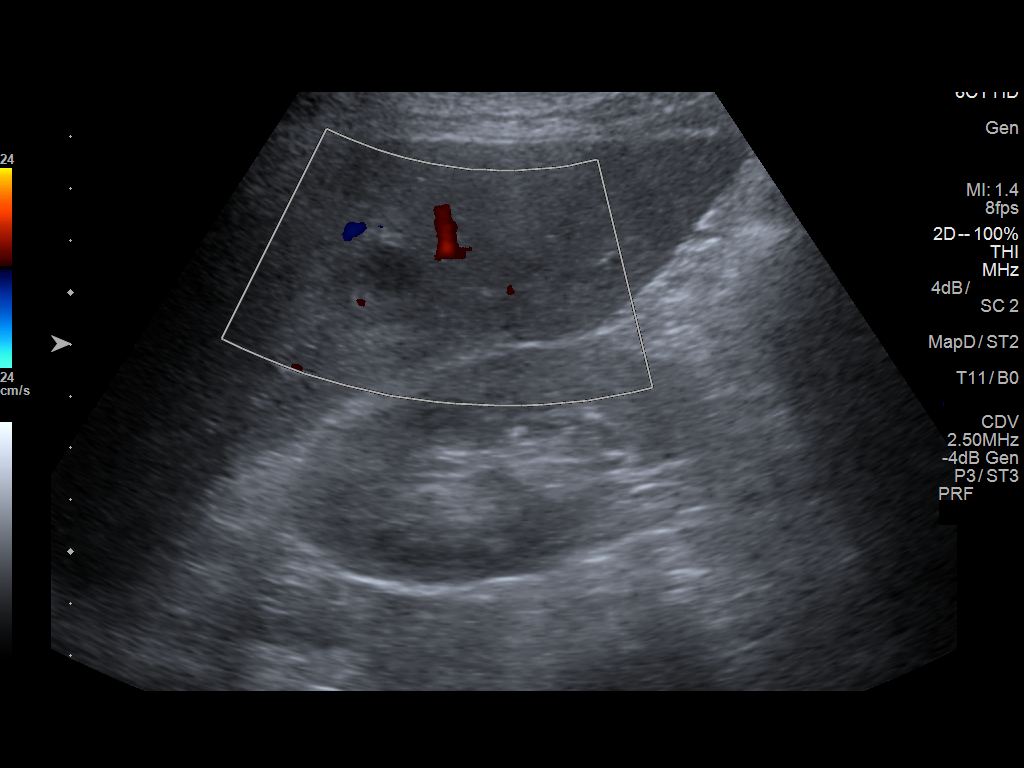
[im 3/7]
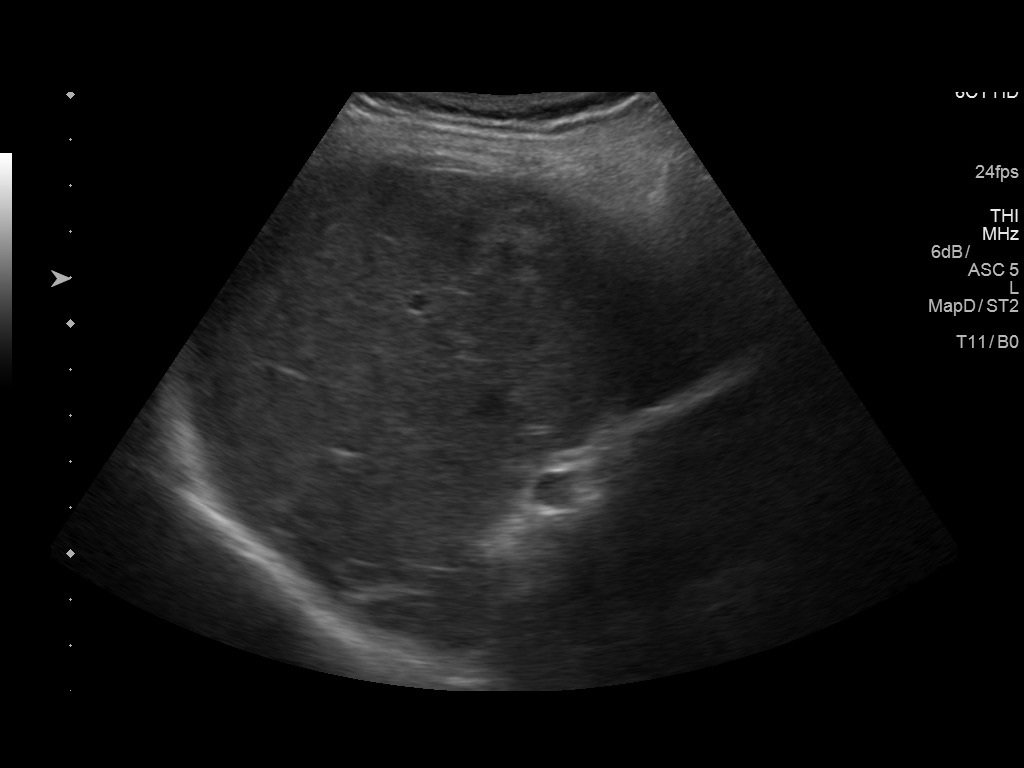
[im 4/7]
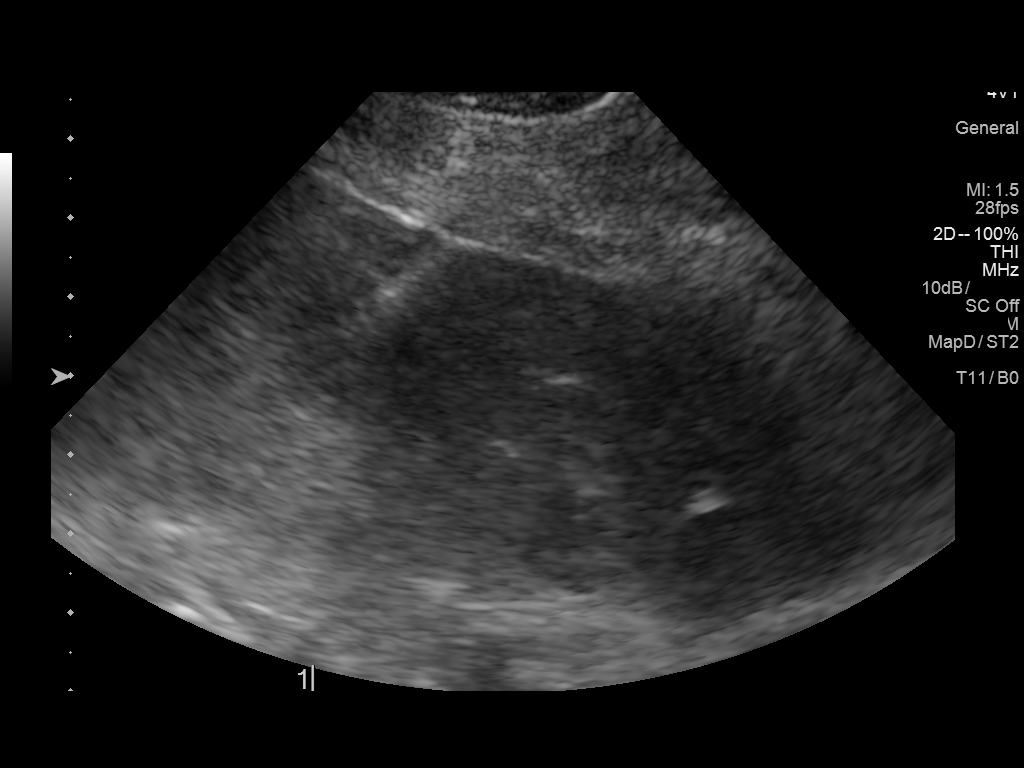
[im 5/7]
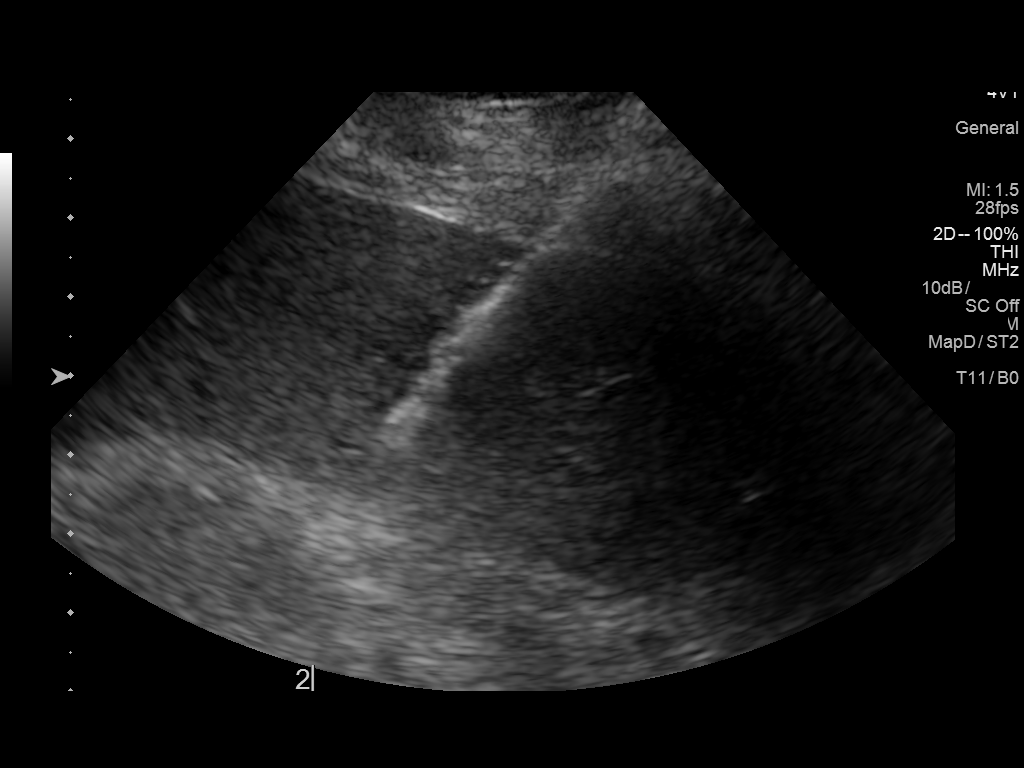
[im 6/7]
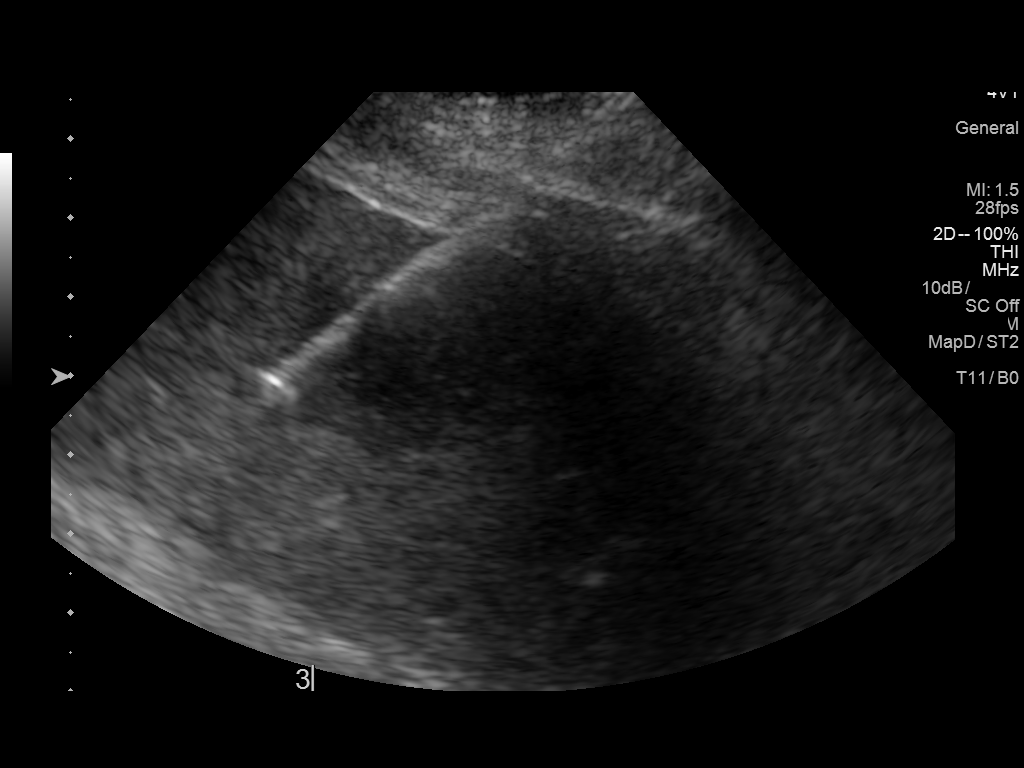
[im 7/7]
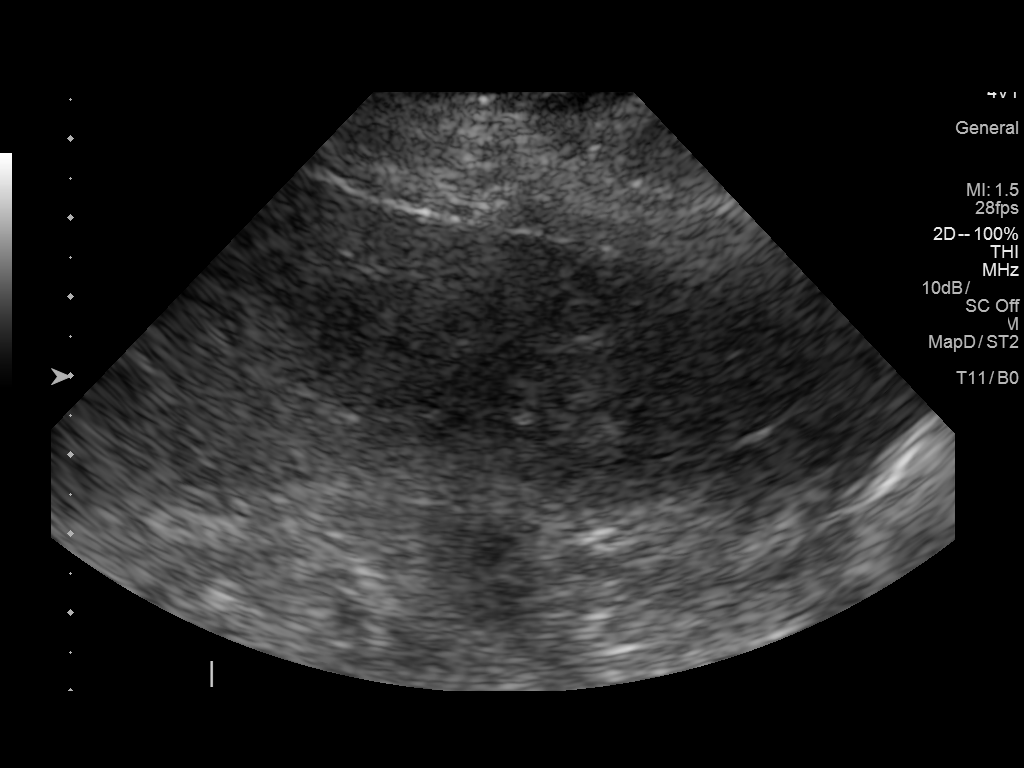

[7 of 7 positions shown; findings below may reference images not displayed]

EXAM:
ULTRASOUND GUIDED CORE BIOPSY OF LIVER

MEDICATIONS:
1.0 mg IV Versed; 50 mcg IV Fentanyl

Total Moderate Sedation Time: 12 minutes.

The patient's level of consciousness and physiologic status were
continuously monitored during the procedure by Radiology nursing.

PROCEDURE:
The procedure, risks, benefits, and alternatives were explained to
the patient. Questions regarding the procedure were encouraged and
answered. The patient understands and consents to the procedure. A
time out was performed prior to initiating the procedure.

The right abdominal wall was prepped with chlorhexidine in a sterile
fashion, and a sterile drape was applied covering the operative
field. A sterile gown and sterile gloves were used for the
procedure. Local anesthesia was provided with 1% Lidocaine.

Ultrasound was used to localize a lesion within the right lobe of
the liver. Under ultrasound guidance, a 17 gauge needle was advanced
to the margin of the lesion. A series of 3 separate 18 gauge core
biopsy samples were then obtained through the 17 gauge needle. Core
biopsy samples were submitted in formalin. The outer needle was
removed and additional ultrasound performed.

COMPLICATIONS:
None.
FINDINGS: Lesion within the inferior right lobe of the liver demonstrates
central necrosis and measures approximately 2 cm in maximal
diameter. Solid tissue was obtained with core biopsy which was
targeted towards the periphery of the lesion.
IMPRESSION: Ultrasound-guided core biopsy performed of a lesion within the
inferior right lobe of the liver.
